# Patient Record
Sex: Female | Born: 1960 | Race: White | Hispanic: No | Marital: Married | Smoking: Never smoker
Health system: Southern US, Community
[De-identification: ages and names within clinical notes are randomized; demographics above are authoritative.]

## PROBLEM LIST (undated history)

## (undated) DIAGNOSIS — F32A Depression, unspecified: Secondary | ICD-10-CM

## (undated) DIAGNOSIS — G4733 Obstructive sleep apnea (adult) (pediatric): Secondary | ICD-10-CM

## (undated) DIAGNOSIS — F5104 Psychophysiologic insomnia: Secondary | ICD-10-CM

## (undated) DIAGNOSIS — E119 Type 2 diabetes mellitus without complications: Secondary | ICD-10-CM

## (undated) DIAGNOSIS — F329 Major depressive disorder, single episode, unspecified: Secondary | ICD-10-CM

## (undated) HISTORY — DX: Major depressive disorder, single episode, unspecified: F32.9

## (undated) HISTORY — DX: Obstructive sleep apnea (adult) (pediatric): G47.33

## (undated) HISTORY — PX: ABLATION: SHX5711

## (undated) HISTORY — PX: UTERINE FIBROID SURGERY: SHX826

## (undated) HISTORY — DX: Depression, unspecified: F32.A

## (undated) HISTORY — DX: Psychophysiologic insomnia: F51.04

## (undated) HISTORY — DX: Type 2 diabetes mellitus without complications: E11.9

---

## 1999-02-21 ENCOUNTER — Ambulatory Visit (HOSPITAL_COMMUNITY): Admission: RE | Admit: 1999-02-21 | Discharge: 1999-02-21 | Payer: Self-pay | Admitting: Obstetrics and Gynecology

## 1999-02-21 ENCOUNTER — Encounter: Payer: Self-pay | Admitting: Obstetrics and Gynecology

## 1999-03-17 ENCOUNTER — Inpatient Hospital Stay (HOSPITAL_COMMUNITY): Admission: RE | Admit: 1999-03-17 | Discharge: 1999-03-20 | Payer: Self-pay | Admitting: Obstetrics and Gynecology

## 1999-07-25 ENCOUNTER — Other Ambulatory Visit: Admission: RE | Admit: 1999-07-25 | Discharge: 1999-07-25 | Payer: Self-pay | Admitting: Obstetrics and Gynecology

## 2002-04-24 ENCOUNTER — Encounter: Payer: Self-pay | Admitting: Obstetrics and Gynecology

## 2002-04-24 ENCOUNTER — Encounter: Admission: RE | Admit: 2002-04-24 | Discharge: 2002-04-24 | Payer: Self-pay | Admitting: Obstetrics and Gynecology

## 2003-04-29 ENCOUNTER — Other Ambulatory Visit: Admission: RE | Admit: 2003-04-29 | Discharge: 2003-04-29 | Payer: Self-pay | Admitting: Obstetrics and Gynecology

## 2003-05-18 ENCOUNTER — Encounter: Admission: RE | Admit: 2003-05-18 | Discharge: 2003-05-18 | Payer: Self-pay | Admitting: Obstetrics and Gynecology

## 2003-05-18 ENCOUNTER — Encounter: Payer: Self-pay | Admitting: Obstetrics and Gynecology

## 2004-05-08 ENCOUNTER — Other Ambulatory Visit: Admission: RE | Admit: 2004-05-08 | Discharge: 2004-05-08 | Payer: Self-pay | Admitting: Obstetrics and Gynecology

## 2004-06-06 ENCOUNTER — Encounter: Admission: RE | Admit: 2004-06-06 | Discharge: 2004-06-06 | Payer: Self-pay | Admitting: Obstetrics and Gynecology

## 2006-09-26 ENCOUNTER — Other Ambulatory Visit: Admission: RE | Admit: 2006-09-26 | Discharge: 2006-09-26 | Payer: Self-pay | Admitting: Obstetrics and Gynecology

## 2006-09-26 ENCOUNTER — Ambulatory Visit (HOSPITAL_COMMUNITY): Admission: RE | Admit: 2006-09-26 | Discharge: 2006-09-26 | Payer: Self-pay | Admitting: Obstetrics and Gynecology

## 2007-09-29 ENCOUNTER — Other Ambulatory Visit: Admission: RE | Admit: 2007-09-29 | Discharge: 2007-09-29 | Payer: Self-pay | Admitting: Obstetrics and Gynecology

## 2007-09-29 ENCOUNTER — Ambulatory Visit (HOSPITAL_COMMUNITY): Admission: RE | Admit: 2007-09-29 | Discharge: 2007-09-29 | Payer: Self-pay | Admitting: Obstetrics and Gynecology

## 2008-11-21 ENCOUNTER — Emergency Department: Payer: Self-pay | Admitting: Emergency Medicine

## 2011-02-19 ENCOUNTER — Ambulatory Visit: Payer: Self-pay | Admitting: Obstetrics and Gynecology

## 2011-02-22 ENCOUNTER — Ambulatory Visit: Payer: Self-pay | Admitting: Obstetrics and Gynecology

## 2011-02-25 LAB — PATHOLOGY REPORT

## 2011-02-28 ENCOUNTER — Ambulatory Visit: Payer: Self-pay | Admitting: Unknown Physician Specialty

## 2011-10-30 HISTORY — PX: CHOLECYSTECTOMY, LAPAROSCOPIC: SHX56

## 2013-05-18 ENCOUNTER — Emergency Department: Payer: Self-pay | Admitting: Emergency Medicine

## 2013-05-18 LAB — URINALYSIS, COMPLETE
Leukocyte Esterase: NEGATIVE
Nitrite: NEGATIVE
Ph: 5 (ref 4.5–8.0)
RBC,UR: <1 /HPF (ref 0–5)
Specific Gravity: 1.014 (ref 1.003–1.030)
Squamous Epithelial: 1

## 2013-05-18 LAB — COMPREHENSIVE METABOLIC PANEL
Alkaline Phosphatase: 89 U/L (ref 50–136)
Anion Gap: 4 — ABNORMAL LOW (ref 7–16)
Bilirubin,Total: 0.6 mg/dL (ref 0.2–1.0)
Calcium, Total: 8.7 mg/dL (ref 8.5–10.1)
Chloride: 106 mmol/L (ref 98–107)
Co2: 26 mmol/L (ref 21–32)
EGFR (African American): >60
EGFR (Non-African Amer.): >60
Glucose: 93 mg/dL (ref 65–99)
Potassium: 4 mmol/L (ref 3.5–5.1)
SGPT (ALT): 44 U/L (ref 12–78)

## 2013-05-18 LAB — CBC
HCT: 40.4 % (ref 35.0–47.0)
HGB: 13.7 g/dL (ref 12.0–16.0)
MCH: 32.7 pg (ref 26.0–34.0)
MCHC: 34 g/dL (ref 32.0–36.0)
MCV: 96 fL (ref 80–100)
Platelet: 217 10*3/uL (ref 150–440)
RBC: 4.21 10*6/uL (ref 3.80–5.20)
RDW: 13.3 % (ref 11.5–14.5)
WBC: 11 10*3/uL (ref 3.6–11.0)

## 2013-05-18 LAB — LIPASE, BLOOD: Lipase: 106 U/L (ref 73–393)

## 2013-05-25 ENCOUNTER — Ambulatory Visit: Payer: Self-pay | Admitting: Unknown Physician Specialty

## 2014-10-29 HISTORY — PX: ENDOMETRIAL ABLATION: SHX621

## 2016-06-07 DIAGNOSIS — G4733 Obstructive sleep apnea (adult) (pediatric): Secondary | ICD-10-CM | POA: Insufficient documentation

## 2016-10-05 ENCOUNTER — Ambulatory Visit (INDEPENDENT_AMBULATORY_CARE_PROVIDER_SITE_OTHER): Payer: 59

## 2016-10-05 ENCOUNTER — Ambulatory Visit (INDEPENDENT_AMBULATORY_CARE_PROVIDER_SITE_OTHER): Payer: 59 | Admitting: Podiatry

## 2016-10-05 VITALS — BP 128/82 | HR 65

## 2016-10-05 DIAGNOSIS — M2010 Hallux valgus (acquired), unspecified foot: Secondary | ICD-10-CM | POA: Diagnosis not present

## 2016-10-05 NOTE — Progress Notes (Signed)
   Subjective:    Patient ID: Diana Zimmerman, female    DOB: [birth year], 55 y.o.   MRN: [id number]  HPI    Review of Systems  All other systems reviewed and are negative.      Objective:   Physical Exam        Assessment & Plan:

## 2016-10-05 NOTE — Patient Instructions (Addendum)
Bunionectomy A bunionectomy is a surgical procedure to remove a bunion. A bunion is a visible bump of bone on the inside of your foot where your big toe meets the rest of your foot. A bunion can develop when pressure turns this bone (first metatarsal) toward the other toes. Shoes that are too tight are the most common cause of bunions. Bunions can also be caused by diseases, such as arthritis and polio. You may need a bunionectomy if your bunion is very large and painful or it affects your ability to walk. Tell a health care provider about:  Any allergies you have.  All medicines you are taking, including vitamins, herbs, eye drops, creams, and over-the-counter medicines.  Any problems you or family members have had with anesthetic medicines.  Any blood disorders you have.  Any surgeries you have had.  Any medical conditions you have. What are the risks? Generally, this is a safe procedure. However, problems may occur, including:  Infection.  Pain.  Nerve damage.  Bleeding or blood clots.  Reactions to medicines.  Numbness, stiffness, or arthritis in your toe.  Foot problems that continue even after the procedure. What happens before the procedure?  Ask your health care provider about:  Changing or stopping your regular medicines. This is especially important if you are taking diabetes medicines or blood thinners.  Taking medicines such as aspirin and ibuprofen. These medicines can thin your blood. Do not take these medicines before your procedure if your health care provider instructs you not to.  Do not drink alcohol before the procedure as directed by your health care provider.  Do not use tobacco products, including cigarettes, chewing tobacco, or electronic cigarettes, before the procedure as directed by your health care provider. If you need help quitting, ask your health care provider.  Ask your health care provider what kind of medicine you will be given during  your procedure. A bunionectomy may be done using one of these:  A medicine that numbs the area (local anesthetic).  A medicine that makes you go to sleep (general anesthetic). If you will be given general anesthetic, do not eat or drink anything after midnight on the night before the procedure or as directed by your health care provider. What happens during the procedure?  An IV tube may be inserted into a vein.  You will be given local anesthetic or general anesthetic.  The surgeon will make a cut (incision) over the enlarged area at the first joint of the big toe. The surgeon will remove the bunion.  You may have more than one incision if any of the bones in your big toe need to be moved. A bone itself may need to be cut.  Sometimes the tissues around the big toe may also need to be cut then tightened or loosened to reposition the toe.  Screws or other hardware may be used to keep your foot in thecorrect position.  The incision will be closed with stitches (sutures) and covered with adhesive strips or another type of bandage (dressing). What happens after the procedure?  You may spend some time in a recovery area.  Your blood pressure, heart rate, breathing rate, and blood oxygen level will be monitored often until the medicines you were given have worn off. This information is not intended to replace advice given to you by your health care provider. Make sure you discuss any questions you have with your health care provider. Document Released: 09/28/2005 Document Revised: 03/22/2016 Document Reviewed: 06/02/2014   Elsevier Interactive Patient Education  2017 [employer]  Pre-Operative Instructions  Congratulations, you have decided to take an important step to improving your quality of life.  You can be assured that the doctors of [facility] will be with you every step of the way.  1. Plan to be at the surgery center/hospital at least 1 (one) hour prior to your scheduled  time unless otherwise directed by the surgical center/hospital staff.  You must have a responsible adult accompany you, remain during the surgery and drive you home.  Make sure you have directions to the surgical center/hospital and know how to get there on time. 2. For hospital based surgery you will need to obtain a history and physical form from your family physician within 1 month prior to the date of surgery- we will give you a form for you primary physician.  3. We make every effort to accommodate the date you request for surgery.  There are however, times where surgery dates or times have to be moved.  We will contact you as soon as possible if a change in schedule is required.   4. No Aspirin/Ibuprofen for one week before surgery.  If you are on aspirin, any non-steroidal anti-inflammatory medications (Mobic, Aleve, Ibuprofen) you should stop taking it 7 days prior to your surgery.  You make take Tylenol  For pain prior to surgery.  5. Medications- If you are taking daily heart and blood pressure medications, seizure, reflux, allergy, asthma, anxiety, pain or diabetes medications, make sure the surgery center/hospital is aware before the day of surgery so they may notify you which medications to take or avoid the day of surgery. 6. No food or drink after midnight the night before surgery unless directed otherwise by surgical center/hospital staff. 7. No alcoholic beverages 24 hours prior to surgery.  No smoking 24 hours prior to or 24 hours after surgery. 8. Wear loose pants or shorts- loose enough to fit over bandages, boots, and casts. 9. No slip on shoes, sneakers are best. 10. Bring your boot with you to the surgery center/hospital.  Also bring crutches or a walker if your physician has prescribed it for you.  If you do not have this equipment, it will be provided for you after surgery. 11. If you have not been contracted by the surgery center/hospital by the day before your surgery, call to  confirm the date and time of your surgery. 12. Leave-time from work may vary depending on the type of surgery you have.  Appropriate arrangements should be made prior to surgery with your employer. 13. Prescriptions will be provided immediately following surgery by your doctor.  Have these filled as soon as possible after surgery and take the medication as directed. 14. Remove nail polish on the operative foot. 15. Wash the night before surgery.  The night before surgery wash the foot and leg well with the antibacterial soap provided and water paying special attention to beneath the toenails and in between the toes.  Rinse thoroughly with water and dry well with a towel.  Perform this wash unless told not to do so by your physician.  Enclosed: 1 Ice pack (please put in freezer the night before surgery)   1 Hibiclens skin cleaner   Pre-op Instructions  If you have any questions regarding the instructions, do not hesitate to call our office.  [place]: [address] [place], [place] [address] [phone]  [place]: [address] [place], [place] [address] [phone]  [place]: [address]  [place], [place] [address] [phone]   Dr. Norman Regal DPM, Dr. Matthew Wagoner DPM, Dr. M. Todd Hyatt DPM, Dr. Titorya Stover DPM  

## 2016-10-05 NOTE — Progress Notes (Signed)
Subjective:     Patient ID: Diana Zimmerman, female   DOB: 1961-07-18, 55 y.o.   MRN: [id number]  HPI patient states I have this painful bunion on my left foot that's been present for several years. States that she has tried wider shoes she's tried soaks she's tried padding and oral anti-inflammatories without relief and it's becoming gradually more tender. The right one is also mildly symptomatic but nowhere to the degree of the left   Review of Systems  All other systems reviewed and are negative.      Objective:   Physical Exam  Constitutional: She is oriented to person, place, and time.  Cardiovascular: Intact distal pulses.   Musculoskeletal: Normal range of motion.  Neurological: She is oriented to person, place, and time.  Skin: Skin is warm.  Nursing note and vitals reviewed.  neurovascular status intact muscle strength adequate range of motion within normal limits. Patient's found have good digital perfusion is well oriented 3 and is noted to have hyperostosis medial aspect first metatarsal head left over right with redness and pain when palpated. 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 I discussed deformity and she states that she wants to get it corrected and needs to do it as soon as possible due to her work schedule. I did have a cancellation next Tuesday to be able to get her in and at this time I did allow her to read a consent form going over alternative treatments complications and the fact that recovery will be approximately 6 months. Patient wants surgery understanding recovery and risk and signs consent form and is also dispensed air fracture walker at  this time with all instructions on usage and is encouraged to call with any further questions prior to procedure  X-ray report indicates on the left that the intermetatarsal angle between the first and second metatarsal bones is approximately 15 with tibial sesamoidal position elevation. On the right foot it is mildly increased to 12

## 2016-10-09 ENCOUNTER — Encounter: Payer: Self-pay | Admitting: Podiatry

## 2016-10-09 ENCOUNTER — Telehealth: Payer: Self-pay | Admitting: *Deleted

## 2016-10-09 DIAGNOSIS — M2012 Hallux valgus (acquired), left foot: Secondary | ICD-10-CM | POA: Diagnosis not present

## 2016-10-09 NOTE — Telephone Encounter (Signed)
"  I'm scheduled to have surgery later on today.  I was given a scrub brush and was told to clean my foot with it.  I was reading the packet and it says to clean my foot with it.  Was I given the wrong thing?"  You are to use it on your foot.  "Okay, that's all I needed to know, that was an easy one for you."

## 2016-10-10 ENCOUNTER — Telehealth: Payer: Self-pay

## 2016-10-10 NOTE — Telephone Encounter (Signed)
LVM regarding post op status 

## 2016-10-11 NOTE — Progress Notes (Signed)
DOS 12.12.2017 [place] Bunionectomy (cutting and moving bone) with Pin Fixation Left

## 2016-10-15 ENCOUNTER — Ambulatory Visit (INDEPENDENT_AMBULATORY_CARE_PROVIDER_SITE_OTHER): Payer: Self-pay | Admitting: Podiatry

## 2016-10-15 ENCOUNTER — Ambulatory Visit (INDEPENDENT_AMBULATORY_CARE_PROVIDER_SITE_OTHER): Payer: 59

## 2016-10-15 ENCOUNTER — Encounter: Payer: Self-pay | Admitting: Podiatry

## 2016-10-15 VITALS — Temp 97.3°F

## 2016-10-15 DIAGNOSIS — M2010 Hallux valgus (acquired), unspecified foot: Secondary | ICD-10-CM | POA: Diagnosis not present

## 2016-10-15 DIAGNOSIS — Z9889 Other specified postprocedural states: Secondary | ICD-10-CM

## 2016-10-15 MED ORDER — OXYCODONE-ACETAMINOPHEN 10-325 MG PO TABS: 1.0000 | ORAL_TABLET | Freq: Three times a day (TID) | ORAL | 0 refills | Status: DC | PRN

## 2016-10-15 NOTE — Progress Notes (Signed)
Subjective :  Patient presents today status post [place] bunionectom left foot. Patient doing well. Minimal pain noted.   Objective :  Incision sites are well coapted with sutures intact. Minimal edema noted. Ecchymosis noted. No sign or evidence of infectious process.   Assessment :  S/p bunionectomy with metatarsal osteotomy left foot.   Plan of care :  Patient evaluated. Dressings changed. Return to clinic in 3 weeks.

## 2016-10-16 ENCOUNTER — Telehealth: Payer: Self-pay | Admitting: *Deleted

## 2016-10-16 NOTE — Telephone Encounter (Signed)
"  Calling regarding [employer].  Her surgery has been approved, case number is [id number].  Approval letter will be sent by mail."

## 2016-10-25 ENCOUNTER — Ambulatory Visit (INDEPENDENT_AMBULATORY_CARE_PROVIDER_SITE_OTHER): Payer: 59 | Admitting: Podiatry

## 2016-10-25 ENCOUNTER — Ambulatory Visit (INDEPENDENT_AMBULATORY_CARE_PROVIDER_SITE_OTHER): Payer: 59

## 2016-10-25 ENCOUNTER — Encounter: Payer: Self-pay | Admitting: Podiatry

## 2016-10-25 DIAGNOSIS — M2010 Hallux valgus (acquired), unspecified foot: Secondary | ICD-10-CM | POA: Diagnosis not present

## 2016-10-25 NOTE — Progress Notes (Signed)
Subjective:     Patient ID: Diana Zimmerman, female   DOB: 20-Jul-1961, 55 y.o.   MRN: [id number]  HPI patient states that she's doing well with just some swelling that she was concerned about that she wanted to get checked   Review of Systems     Objective:   Physical Exam Neurovascular status intact negative Homans sign was noted with well-healed surgical site right first metatarsal with wound edges well coapted and good range of motion    Assessment:     Mild edema in the dorsum of the joint surface    Plan:     Precautionary x-ray taken and discussed continued elevation compression and not walking on this part of her foot along with continued immobilization. Patient be checked back 4 weeks or earlier if needed  X-ray indicated osteotomy is healing well pins in place with no indications of movement

## 2016-11-22 ENCOUNTER — Encounter: Payer: Self-pay | Admitting: *Deleted

## 2016-11-22 ENCOUNTER — Emergency Department
Admission: EM | Admit: 2016-11-22 | Discharge: 2016-11-23 | Disposition: A | Discharge status: Home / Self Care | Payer: 59 | Attending: Emergency Medicine | Admitting: Emergency Medicine

## 2016-11-22 ENCOUNTER — Encounter: Payer: Self-pay | Admitting: Podiatry

## 2016-11-22 ENCOUNTER — Ambulatory Visit (INDEPENDENT_AMBULATORY_CARE_PROVIDER_SITE_OTHER): Payer: Self-pay | Admitting: Podiatry

## 2016-11-22 ENCOUNTER — Ambulatory Visit (INDEPENDENT_AMBULATORY_CARE_PROVIDER_SITE_OTHER): Payer: 59

## 2016-11-22 VITALS — BP 111/71 | HR 69 | Resp 16

## 2016-11-22 DIAGNOSIS — M21619 Bunion of unspecified foot: Secondary | ICD-10-CM | POA: Diagnosis not present

## 2016-11-22 DIAGNOSIS — K5732 Diverticulitis of large intestine without perforation or abscess without bleeding: Secondary | ICD-10-CM | POA: Diagnosis not present

## 2016-11-22 DIAGNOSIS — Z79899 Other long term (current) drug therapy: Secondary | ICD-10-CM | POA: Diagnosis not present

## 2016-11-22 DIAGNOSIS — R1032 Left lower quadrant pain: Secondary | ICD-10-CM | POA: Diagnosis present

## 2016-11-22 LAB — CBC
HCT: 44 % (ref 35.0–47.0)
HEMOGLOBIN: 14.9 g/dL (ref 12.0–16.0)
MCH: 32.1 pg (ref 26.0–34.0)
MCHC: 33.8 g/dL (ref 32.0–36.0)
MCV: 94.9 fL (ref 80.0–100.0)
PLATELETS: 210 10*3/uL (ref 150–440)
RBC: 4.63 MIL/uL (ref 3.80–5.20)
RDW: 12.9 % (ref 11.5–14.5)
WBC: 14.5 10*3/uL — ABNORMAL HIGH (ref 3.6–11.0)

## 2016-11-22 LAB — URINALYSIS, COMPLETE (UACMP) WITH MICROSCOPIC
Bacteria, UA: NONE SEEN
Bilirubin Urine: NEGATIVE
GLUCOSE, UA: NEGATIVE mg/dL
HGB URINE DIPSTICK: NEGATIVE
Ketones, ur: NEGATIVE mg/dL
NITRITE: NEGATIVE
PH: 6 (ref 5.0–8.0)
PROTEIN: NEGATIVE mg/dL
SPECIFIC GRAVITY, URINE: 1.016 (ref 1.005–1.030)

## 2016-11-22 LAB — COMPREHENSIVE METABOLIC PANEL
ALK PHOS: 81 U/L (ref 38–126)
ALT: 39 U/L (ref 14–54)
ANION GAP: 8 (ref 5–15)
AST: 27 U/L (ref 15–41)
Albumin: 4.1 g/dL (ref 3.5–5.0)
BILIRUBIN TOTAL: 1.1 mg/dL (ref 0.3–1.2)
BUN: 15 mg/dL (ref 6–20)
CALCIUM: 9.2 mg/dL (ref 8.9–10.3)
CO2: 28 mmol/L (ref 22–32)
CREATININE: 1.12 mg/dL — AB (ref 0.44–1.00)
Chloride: 100 mmol/L — ABNORMAL LOW (ref 101–111)
GFR calc non Af Amer: 53 mL/min — ABNORMAL LOW (ref >60)
GLUCOSE: 118 mg/dL — AB (ref 65–99)
Potassium: 4.1 mmol/L (ref 3.5–5.1)
Sodium: 136 mmol/L (ref 135–145)
TOTAL PROTEIN: 7.8 g/dL (ref 6.5–8.1)

## 2016-11-22 LAB — LIPASE, BLOOD: Lipase: 26 U/L (ref 11–51)

## 2016-11-22 MED ORDER — ONDANSETRON HCL 4 MG/2ML IJ SOLN
4.0000 mg | Freq: Once | INTRAMUSCULAR | Status: AC
  Administered 2016-11-22: 4 mg via INTRAVENOUS

## 2016-11-22 MED ORDER — MORPHINE SULFATE (PF) 2 MG/ML IV SOLN
2.0000 mg | Freq: Once | INTRAVENOUS | Status: AC
  Administered 2016-11-22: 2 mg via INTRAVENOUS

## 2016-11-22 MED ORDER — ONDANSETRON HCL 4 MG/2ML IJ SOLN
INTRAMUSCULAR | Status: AC
  Administered 2016-11-22: 4 mg via INTRAVENOUS
  Filled 2016-11-22: qty 2

## 2016-11-22 MED ORDER — MORPHINE SULFATE (PF) 2 MG/ML IV SOLN
INTRAVENOUS | Status: AC
  Administered 2016-11-22: 2 mg via INTRAVENOUS
  Filled 2016-11-22: qty 1

## 2016-11-22 MED ORDER — IOPAMIDOL (ISOVUE-300) INJECTION 61%
30.0000 mL | Freq: Once | INTRAVENOUS | Status: AC | PRN
  Administered 2016-11-22: 30 mL via ORAL

## 2016-11-22 NOTE — Progress Notes (Signed)
Subjective:     Patient ID: Diana Zimmerman, female   DOB: [birth year], 56 y.o.   MRN: [id number]  HPI patient points to left foot stating that she's doing very well and that she's very happy with result   Review of Systems     Objective:   Physical Exam Neurovascular status intact negative Homan sign was noted with patient's left foot doing well with wound edges well coapted good alignment and good range of motion    Assessment:     Doing well post osteotomy first metatarsal left    Plan:     Reviewed x-rays and advised on continued range of motion exercises and supportive shoe gear usage  X-rays indicate that there is good healing of the osteotomy pins in place with no indications of movement

## 2016-11-22 NOTE — ED Triage Notes (Signed)
Pt ambulatory to triage.  Pt has left lower abd pain.  Pt also has lower back pain.  Pt has nausea.   No vag discharge or bleeding.  Pt alert.

## 2016-11-22 NOTE — ED Provider Notes (Signed)
[facility] Emergency Department Provider Note    First MD Initiated Contact with Patient 11/22/16 2325     (approximate)  I have reviewed the triage vital signs and the nursing notes.   HISTORY  Chief Complaint Abdominal Pain    HPI Diana Zimmerman is a 56 y.o. female presents with acute onset of 5 out of 10 left lower quadrant pain nausea for 1 day. Patient denies any vomiting constipation or diarrhea. Patient temp to 100 on arrival to the urgency department. Patient denies hematuria no dysuria or frequency or urgency.   Past medical History None There are no active problems to display for this patient.   Past surgical history None  Prior to Admission medications   Medication Sig Start Date End Date Taking? Authorizing Provider  ciprofloxacin (CIPRO) 500 MG tablet Take 1 tablet (500 mg total) by mouth 2 (two) times daily. 11/23/16 12/03/16  Darci Current, MD  citalopram (CELEXA) 40 MG tablet Take by mouth. 09/13/16   Historical Provider, MD  metroNIDAZOLE (FLAGYL) 500 MG tablet Take 1 tablet (500 mg total) by mouth 2 (two) times daily. 11/23/16 12/03/16  Darci Current, MD  ondansetron (ZOFRAN ODT) 4 MG disintegrating tablet Take 1 tablet (4 mg total) by mouth every 8 (eight) hours as needed for nausea or vomiting. 11/23/16   Darci Current, MD  oxyCODONE-acetaminophen (ROXICET) 5-325 MG tablet Take 1 tablet by mouth every 6 (six) hours as needed. 11/23/16 11/23/17  Darci Current, MD  traZODone (DESYREL) 50 MG tablet Take by mouth. 08/22/16 08/22/17  Historical Provider, MD    Allergies Patient has no known allergies.  No family history on file.  Social History Social History  Substance Use Topics  . Smoking status: Never Smoker  . Smokeless tobacco: Never Used  . Alcohol use No    Review of Systems Constitutional: No fever/chills Eyes: No visual changes. ENT: No sore throat. Cardiovascular: Denies chest pain. Respiratory: Denies  shortness of breath. Gastrointestinal:Positive for left lower quadrant abdominal pain and nausea Genitourinary: Negative for dysuria. Musculoskeletal: Negative for back pain. Skin: Negative for rash. Neurological: Negative for headaches, focal weakness or numbness.  10-point ROS otherwise negative.  ____________________________________________   PHYSICAL EXAM:  VITAL SIGNS: ED Triage Vitals  Enc Vitals Group     BP 11/22/16 1917 (!) 146/80     Pulse Rate 11/22/16 1917 86     Resp 11/22/16 1917 (!) 22     Temp 11/22/16 1917 100 F (37.8 C)     Temp Source 11/22/16 1917 Oral     SpO2 11/22/16 1917 99 %     Weight 11/22/16 1918 190 lb (86.2 kg)     Height 11/22/16 1918 5\' 4"  (1.626 m)     Head Circumference --      Peak Flow --      Pain Score 11/22/16 1919 5     Pain Loc --      Pain Edu? --      Excl. in GC? --     Constitutional: Alert and oriented. Apparent discomfort Eyes: Conjunctivae are normal. PERRL. EOMI. Head: Atraumatic. Mouth/Throat: Mucous membranes are moist.  Oropharynx non-erythematous. Neck: No stridor.   Cardiovascular: Normal rate, regular rhythm. Good peripheral circulation. Grossly normal heart sounds. Respiratory: Normal respiratory effort.  No retractions. Lungs CTAB. Gastrointestinal: LLQ pain with palpattion No distention.  Musculoskeletal: No lower extremity tenderness nor edema. No gross deformities of extremities. Neurologic:  Normal speech and language. No  gross focal neurologic deficits are appreciated.  Skin:  Skin is warm, dry and intact. No rash noted.   ____________________________________________   LABS (all labs ordered are listed, but only abnormal results are displayed)  Labs Reviewed  COMPREHENSIVE METABOLIC PANEL - Abnormal; Notable for the following:       Result Value   Chloride 100 (*)    Glucose, Bld 118 (*)    Creatinine, Ser 1.12 (*)    GFR calc non Af Amer 53 (*)    All other components within normal limits  CBC -  Abnormal; Notable for the following:    WBC 14.5 (*)    All other components within normal limits  URINALYSIS, COMPLETE (UACMP) WITH MICROSCOPIC - Abnormal; Notable for the following:    Color, Urine YELLOW (*)    APPearance CLEAR (*)    Leukocytes, UA LARGE (*)    Squamous Epithelial / LPF 0-5 (*)    All other components within normal limits  LIPASE, BLOOD    RADIOLOGY I, Troy N BROWN, personally viewed and evaluated these images (plain radiographs) as part of my medical decision making, as well as reviewing the written report by the radiologist.  Ct Abdomen Pelvis W Contrast  Result Date: 11/23/2016 CLINICAL DATA:  56 year old female with left lower quadrant abdominal pain and nausea. EXAM: CT ABDOMEN AND PELVIS WITH CONTRAST TECHNIQUE: Multidetector CT imaging of the abdomen and pelvis was performed using the standard protocol following bolus administration of intravenous contrast. CONTRAST:  ISOVUE-300 IOPAMIDOL (ISOVUE-300) INJECTION 61% COMPARISON:  Abdominal CT dated 11/21/2008 FINDINGS: Lower chest: The visualized lung bases are clear. No intra-abdominal free air or free fluid. Hepatobiliary: Diffuse fatty infiltration of the liver. No intrahepatic biliary ductal dilatation. Cholecystectomy. Mild dilatation of the CBD with normal tapering at the head of the pancreas, likely post cholecystectomy. No retained CBD stone. Pancreas: Unremarkable. No pancreatic ductal dilatation or surrounding inflammatory changes. Spleen: Normal in size without focal abnormality. Adrenals/Urinary Tract: The adrenal glands appear unremarkable. There is an 11 mm left renal upper pole cyst. Subcentimeter hypodense lesion in the upper pole of the left kidney is too small to characterize but most likely represents a cyst. The kidneys otherwise appear unremarkable. There is symmetric uptake and excretion of contrast by the kidneys. The visualized ureters and urinary bladder appear unremarkable. Stomach/Bowel:  There is sigmoid diverticulosis. There is focal area of thickening and inflammatory changes of the distal descending/ sigmoid junction compatible with acute diverticulitis. No evidence of diverticular perforation or abscess. There are scattered colonic diverticula. There is no evidence of bowel obstruction. Normal appendix. Vascular/Lymphatic: No significant vascular findings are present. No enlarged abdominal or pelvic lymph nodes. Reproductive: The uterus is anteverted and grossly unremarkable. The ovaries appear unremarkable as well. No pelvic mass. Other: Small fat containing umbilical hernia. Musculoskeletal: Mild degenerative changes of the spine. No acute fracture. IMPRESSION: Sigmoid diverticulitis.  No diverticular abscess or perforation. Fatty liver. Electronically Signed   By: Elgie Collard M.D.   On: 11/23/2016 01:04      Procedures    INITIAL IMPRESSION / ASSESSMENT AND PLAN / ED COURSE  Pertinent labs & imaging results that were available during my care of the patient were reviewed by me and considered in my medical decision making (see chart for details).  56 year old female presents in the emergency department with left lower quadrant abdominal pain consistent with possible diverticulitis which was confirmed on CT scan. Patient given Cipro and Flagyl in the emergency department as well  as IV morphine for pain with improvement of pain.      ____________________________________________  FINAL CLINICAL IMPRESSION(S) / ED DIAGNOSES  Final diagnoses:  Diverticulitis of large intestine without perforation or abscess without bleeding     MEDICATIONS GIVEN DURING THIS VISIT:  Medications  ciprofloxacin (CIPRO) tablet 500 mg (not administered)  metroNIDAZOLE (FLAGYL) tablet 500 mg (not administered)  morphine 2 MG/ML injection 2 mg (2 mg Intravenous Given 11/22/16 2340)  ondansetron (ZOFRAN) injection 4 mg (4 mg Intravenous Given 11/22/16 2345)  iopamidol (ISOVUE-300) 61 %  injection 30 mL (30 mLs Oral Contrast Given 11/22/16 2359)  iopamidol (ISOVUE-300) 61 % injection 100 mL (100 mLs Intravenous Contrast Given 11/23/16 0033)  oxyCODONE-acetaminophen (PERCOCET/ROXICET) 5-325 MG per tablet 1 tablet (1 tablet Oral Given 11/23/16 0157)     NEW OUTPATIENT MEDICATIONS STARTED DURING THIS VISIT:  New Prescriptions   CIPROFLOXACIN (CIPRO) 500 MG TABLET    Take 1 tablet (500 mg total) by mouth 2 (two) times daily.   METRONIDAZOLE (FLAGYL) 500 MG TABLET    Take 1 tablet (500 mg total) by mouth 2 (two) times daily.   ONDANSETRON (ZOFRAN ODT) 4 MG DISINTEGRATING TABLET    Take 1 tablet (4 mg total) by mouth every 8 (eight) hours as needed for nausea or vomiting.   OXYCODONE-ACETAMINOPHEN (ROXICET) 5-325 MG TABLET    Take 1 tablet by mouth every 6 (six) hours as needed.    Modified Medications   No medications on file    Discontinued Medications   No medications on file     Note:  This document was prepared using Dragon voice recognition software and may include unintentional dictation errors.    Darci Currentandolph N Brown, MD 11/23/16 Emeline [id number]

## 2016-11-22 NOTE — ED Notes (Signed)
Dr. Brown at bedside for pt evaluation  

## 2016-11-23 ENCOUNTER — Encounter: Payer: Self-pay | Admitting: Radiology

## 2016-11-23 ENCOUNTER — Emergency Department: Payer: 59

## 2016-11-23 MED ORDER — CIPROFLOXACIN HCL 500 MG PO TABS: 500.0000 mg | ORAL_TABLET | Freq: Two times a day (BID) | ORAL | 0 refills | Status: AC

## 2016-11-23 MED ORDER — AMOXICILLIN-POT CLAVULANATE 875-125 MG PO TABS: 1.0000 | ORAL_TABLET | Freq: Once | ORAL | Status: DC

## 2016-11-23 MED ORDER — OXYCODONE-ACETAMINOPHEN 5-325 MG PO TABS
1.0000 | ORAL_TABLET | Freq: Once | ORAL | Status: AC
Start: 2016-11-23 — End: 2016-11-23
  Administered 2016-11-23: 1 via ORAL

## 2016-11-23 MED ORDER — CIPROFLOXACIN HCL 500 MG PO TABS
500.0000 mg | ORAL_TABLET | Freq: Once | ORAL | Status: AC
  Administered 2016-11-23: 500 mg via ORAL
  Filled 2016-11-23: qty 1

## 2016-11-23 MED ORDER — ONDANSETRON 4 MG PO TBDP: 4.0000 mg | ORAL_TABLET | Freq: Three times a day (TID) | ORAL | 0 refills | Status: DC | PRN

## 2016-11-23 MED ORDER — METRONIDAZOLE 500 MG PO TABS: 500.0000 mg | ORAL_TABLET | Freq: Two times a day (BID) | ORAL | 0 refills | Status: AC

## 2016-11-23 MED ORDER — METRONIDAZOLE 500 MG PO TABS
500.0000 mg | ORAL_TABLET | Freq: Once | ORAL | Status: AC
  Administered 2016-11-23: 500 mg via ORAL
  Filled 2016-11-23: qty 1

## 2016-11-23 MED ORDER — IOPAMIDOL (ISOVUE-300) INJECTION 61%
100.0000 mL | Freq: Once | INTRAVENOUS | Status: AC | PRN
  Administered 2016-11-23: 100 mL via INTRAVENOUS

## 2016-11-23 MED ORDER — OXYCODONE-ACETAMINOPHEN 5-325 MG PO TABS
ORAL_TABLET | ORAL | Status: AC
  Administered 2016-11-23: 1 via ORAL
  Filled 2016-11-23: qty 1

## 2016-11-23 MED ORDER — OXYCODONE-ACETAMINOPHEN 5-325 MG PO TABS: 1.0000 | ORAL_TABLET | Freq: Four times a day (QID) | ORAL | 0 refills | Status: AC | PRN

## 2016-11-23 NOTE — ED Notes (Signed)
Dr. Brown at the bedside to discuss plan of care 

## 2016-12-27 ENCOUNTER — Ambulatory Visit (INDEPENDENT_AMBULATORY_CARE_PROVIDER_SITE_OTHER): Payer: 59 | Admitting: Podiatry

## 2016-12-27 ENCOUNTER — Ambulatory Visit (INDEPENDENT_AMBULATORY_CARE_PROVIDER_SITE_OTHER): Payer: 59

## 2016-12-27 DIAGNOSIS — Z9889 Other specified postprocedural states: Secondary | ICD-10-CM

## 2016-12-27 DIAGNOSIS — M2010 Hallux valgus (acquired), unspecified foot: Secondary | ICD-10-CM | POA: Diagnosis not present

## 2016-12-27 DIAGNOSIS — M21619 Bunion of unspecified foot: Secondary | ICD-10-CM | POA: Diagnosis not present

## 2016-12-27 NOTE — Progress Notes (Signed)
Subjective:     Patient ID: Diana Zimmerman, female   DOB: 1961-08-15, 56 y.o.   MRN: [id number]  HPI patient states she's doing real well with minimal discomfort and able to wear shoe gear   Review of Systems     Objective:   Physical Exam Neurovascular status intact negative Homan sign was noted with well-healed surgical site left    Assessment:     Doing well post osteotomy left    Plan:     Advised on anti-inflammatories and gradual return to normal shoe gear and reviewed x-rays with reduced immobilization  X-ray report indicated osteotomy is healing well pins in place with no signs of movement

## 2017-02-28 ENCOUNTER — Ambulatory Visit (INDEPENDENT_AMBULATORY_CARE_PROVIDER_SITE_OTHER): Payer: Self-pay | Admitting: Podiatry

## 2017-02-28 ENCOUNTER — Other Ambulatory Visit: Payer: 59

## 2017-02-28 ENCOUNTER — Ambulatory Visit (INDEPENDENT_AMBULATORY_CARE_PROVIDER_SITE_OTHER): Payer: 59

## 2017-02-28 DIAGNOSIS — M21619 Bunion of unspecified foot: Secondary | ICD-10-CM | POA: Diagnosis not present

## 2017-02-28 DIAGNOSIS — M2010 Hallux valgus (acquired), unspecified foot: Secondary | ICD-10-CM

## 2017-02-28 DIAGNOSIS — Z9889 Other specified postprocedural states: Secondary | ICD-10-CM | POA: Diagnosis not present

## 2017-03-02 NOTE — Progress Notes (Signed)
Subjective:    Patient ID: Diana Zimmerman, female   DOB: 56 y.o.   MRN: [id number]   HPI patient states I'm doing fine with minimal discomfort    ROS      Objective:  Physical Exam Neurovascular status intact with patient found to have inflammation pain around the first MPJ left with nice improvement that occurred over the last 8 weeks with minimal swelling still noted    Assessment:    Doing well post osteotomy first metatarsal left with wound edges well coapted hallux in rectus position and good range of motion     Plan:    Doing well with explanation that she will continue to utilize elevation compression as needed and will gradually increase her shoe gear type   X-rays indicate yes M is healing well with pins in place joint congruence and no indication to pathology

## 2017-05-08 DIAGNOSIS — R200 Anesthesia of skin: Secondary | ICD-10-CM | POA: Insufficient documentation

## 2017-05-08 DIAGNOSIS — R202 Paresthesia of skin: Secondary | ICD-10-CM | POA: Insufficient documentation

## 2017-05-15 DIAGNOSIS — G5602 Carpal tunnel syndrome, left upper limb: Secondary | ICD-10-CM | POA: Insufficient documentation

## 2017-06-13 DIAGNOSIS — R2681 Unsteadiness on feet: Secondary | ICD-10-CM | POA: Insufficient documentation

## 2017-06-13 DIAGNOSIS — R278 Other lack of coordination: Secondary | ICD-10-CM | POA: Insufficient documentation

## 2017-07-10 DIAGNOSIS — H5711 Ocular pain, right eye: Secondary | ICD-10-CM | POA: Insufficient documentation

## 2017-07-10 DIAGNOSIS — R519 Headache, unspecified: Secondary | ICD-10-CM | POA: Insufficient documentation

## 2017-08-08 DIAGNOSIS — H02834 Dermatochalasis of left upper eyelid: Secondary | ICD-10-CM | POA: Insufficient documentation

## 2018-09-23 ENCOUNTER — Other Ambulatory Visit (HOSPITAL_COMMUNITY)
Admission: RE | Admit: 2018-09-23 | Discharge: 2018-09-23 | Disposition: A | Discharge status: Home / Self Care | Payer: 59 | Source: Ambulatory Visit | Attending: Obstetrics and Gynecology | Admitting: Obstetrics and Gynecology

## 2018-09-23 ENCOUNTER — Ambulatory Visit (INDEPENDENT_AMBULATORY_CARE_PROVIDER_SITE_OTHER): Payer: 59 | Admitting: Obstetrics and Gynecology

## 2018-09-23 ENCOUNTER — Encounter: Payer: Self-pay | Admitting: Obstetrics and Gynecology

## 2018-09-23 VITALS — BP 120/88 | Ht 64.0 in | Wt 205.0 lb

## 2018-09-23 DIAGNOSIS — Z0000 Encounter for general adult medical examination without abnormal findings: Secondary | ICD-10-CM | POA: Insufficient documentation

## 2018-09-23 DIAGNOSIS — Z124 Encounter for screening for malignant neoplasm of cervix: Secondary | ICD-10-CM

## 2018-09-23 DIAGNOSIS — Z1239 Encounter for other screening for malignant neoplasm of breast: Secondary | ICD-10-CM

## 2018-09-23 DIAGNOSIS — Z01419 Encounter for gynecological examination (general) (routine) without abnormal findings: Secondary | ICD-10-CM

## 2018-09-23 NOTE — Progress Notes (Signed)
Gynecology Annual Exam  PCP: Rayetta HumphreyGeorge, Sionne A, MD  Chief Complaint:  Chief Complaint  Patient presents with  . Gynecologic Exam    History of Present Illness:Patient is a 57 y.o. G1P0101 presents for annual exam. The patient has no complaints today.   LMP: No LMP recorded. Patient has had an ablation. Menarche:not applicable  Vaginal Bleeding: no Postcoital Bleeding: no Dysmenorrhea: not applicable  The patient is sexually active. She admits to dyspareunia.  The patient does perform self breast exams.  There is no notable family history of breast or ovarian cancer in her family.  The patient wears seatbelts: yes.   The patient has regular exercise: not asked.    The patient denies current symptoms of depression.     Review of Systems: Review of Systems  Constitutional: Negative for chills, fever, malaise/fatigue and weight loss.  HENT: Negative for congestion, hearing loss and sinus pain.   Eyes: Negative for blurred vision and double vision.  Respiratory: Negative for cough, sputum production, shortness of breath and wheezing.   Cardiovascular: Negative for chest pain, palpitations, orthopnea and leg swelling.  Gastrointestinal: Positive for diarrhea. Negative for abdominal pain, constipation, nausea and vomiting.       Chronic diarrhea, work up negative  Genitourinary: Negative for dysuria, flank pain, frequency, hematuria and urgency.       + leakage of urine  Musculoskeletal: Negative for back pain, falls and joint pain.  Skin: Negative for itching and rash.  Neurological: Negative for dizziness and headaches.  Psychiatric/Behavioral: Negative for depression, substance abuse and suicidal ideas. The patient is not nervous/anxious.     Past Medical History:  Past Medical History:  Diagnosis Date  . Depression   . Diabetes mellitus without complication [facility](HCC)     Past Surgical History:  Past Surgical History:  Procedure Laterality Date  . ABLATION    .  CESAREAN SECTION    . UTERINE FIBROID SURGERY      Gynecologic History:  No LMP recorded. Patient has had an ablation. Last Pap: Results were:  3 years ago, normal per patient Mammogram results: 3 years ago normal per patient  Obstetric History: G1P0101  Family History:  Family History  Problem Relation Age of Onset  . Colon cancer Mother   . Heart disease Father     Social History:  Social History   Socioeconomic History  . Marital status: Married    Spouse name: Not on file  . Number of children: Not on file  . Years of education: Not on file  . Highest education level: Not on file  Occupational History  . Not on file  Social Needs  . Financial resource strain: Not on file  . Food insecurity:    Worry: Not on file    Inability: Not on file  . Transportation needs:    Medical: Not on file    Non-medical: Not on file  Tobacco Use  . Smoking status: Never Smoker  . Smokeless tobacco: Never Used  Substance and Sexual Activity  . Alcohol use: Yes    Comment: occasionally   . Drug use: Never  . Sexual activity: Yes    Birth control/protection: Surgical  Lifestyle  . Physical activity:    Days per week: Not on file    Minutes per session: Not on file  . Stress: Not on file  Relationships  . Social connections:    Talks on phone: Not on file    Gets together: Not on  file    Attends religious service: Not on file    Active member of club or organization: Not on file    Attends meetings of clubs or organizations: Not on file    Relationship status: Not on file  . Intimate partner violence:    Fear of current or ex partner: Not on file    Emotionally abused: Not on file    Physically abused: Not on file    Forced sexual activity: Not on file  Other Topics Concern  . Not on file  Social History Narrative  . Not on file    Allergies:  No Known Allergies  Medications: Prior to Admission medications   Medication Sig Start Date End Date Taking? Authorizing  Provider  buPROPion (WELLBUTRIN XL) 150 MG 24 hr tablet Take by mouth. 09/02/18 09/02/19 Yes [provider]  citalopram (CELEXA) 40 MG tablet Take by mouth. 09/13/16  Yes [provider]  Multiple Vitamin (MULTI-VITAMINS) TABS Take by mouth.   Yes [provider]  polyethylene glycol (GAVILYTE-G) 236 g solution as directed 06/06/18  Yes [provider]  gabapentin (NEURONTIN) 300 MG capsule TAKE 3 CAPSULES (900 MG TOTAL) BY MOUTH NIGHTLY. 03/15/17   [provider]  traZODone (DESYREL) 50 MG tablet Take by mouth. 08/22/16 08/22/17  [provider]    Physical Exam Vitals: Blood pressure 120/88, height 5\' 4"  (1.626 m), weight 205 lb (93 kg).  General: NAD HEENT: normocephalic, anicteric Thyroid: no enlargement, no palpable nodules Pulmonary: No increased work of breathing, CTAB Cardiovascular: RRR, distal pulses 2+ Breast: Breast symmetrical, no tenderness, no palpable nodules or masses, no skin or nipple retraction present, no nipple discharge.  No axillary or supraclavicular lymphadenopathy. Abdomen: NABS, soft, non-tender, non-distended.  Umbilicus without lesions.  No hepatomegaly, splenomegaly or masses palpable. No evidence of hernia  Genitourinary:  External: Normal external female genitalia.  Normal urethral meatus, normal Bartholin's and Skene's glands.    Vagina: Normal vaginal mucosa, no evidence of prolapse.    Cervix: Grossly normal in appearance, no bleeding  Uterus: Non-enlarged, mobile, normal contour.  No CMT  Adnexa: ovaries non-enlarged, no adnexal masses  Rectal: deferred  Lymphatic: no evidence of inguinal lymphadenopathy Extremities: no edema, erythema, or tenderness Neurologic: Grossly intact Psychiatric: mood appropriate, affect full  Female chaperone present for pelvic and breast  portions of the physical exam     Assessment: 57 y.o. G1P0101 routine annual exam  Plan: Problem List Items Addressed This  Visit    None    Visit Diagnoses    Healthcare maintenance    -  Primary   Relevant Orders   Cytology - PAP   MM DIGITAL SCREENING BILATERAL   Screening for cervical cancer       Relevant Orders   Cytology - PAP   Screening breast examination       Relevant Orders   MM DIGITAL SCREENING BILATERAL      1) Mammogram - recommend yearly screening mammogram.  Mammogram Was ordered today  2) STI screening  was offered and declined  3) ASCCP guidelines and rational discussed.  Patient opts for every 3 years screening interval  4) Osteoporosis  - per USPTF routine screening DEXA at age 4 - FRAX 10 year major fracture risk 74,  10 year hip fracture risk 1  Consider FDA-approved medical therapies in postmenopausal women and men aged 61 years and older, based on the following: a) A hip or vertebral (clinical or morphometric) fracture b) T-score ? -  2.5 at the femoral neck or spine after appropriate evaluation to exclude secondary causes C) Low bone mass (T-score between -1.0 and -2.5 at the femoral neck or spine) and a 10-year probability of a hip fracture ? 3% or a 10-year probability of a major osteoporosis-related fracture ? 20% based on the [place]-adapted WHO algorithm   5) Routine healthcare maintenance including cholesterol, diabetes screening discussed managed by PCP  6) Colonoscopy is up to date. She reports that she had multiple colon polyps on her last one and has to have another colonoscopy in a few months.   7) Return in about 1 year (around 09/24/2019) for annual.  8) Issues with urinary incontinence- reviewed options- no treatment at this time Vaginal narrowing- pain with intercourse, reviewed options- no treatment at this time, given sample of premarin cream.  Adelene Idler MD [facility], [facility] 09/23/18 5:30 PM

## 2018-09-26 LAB — CYTOLOGY - PAP
Diagnosis: NEGATIVE
HPV: NOT DETECTED

## 2018-09-29 NOTE — Progress Notes (Signed)
Please call patient with normal pap smear result

## 2018-09-30 NOTE — Progress Notes (Signed)
Pt is aware.  

## 2019-01-09 ENCOUNTER — Encounter: Payer: Self-pay | Admitting: Gynecology

## 2019-01-09 ENCOUNTER — Other Ambulatory Visit: Payer: Self-pay

## 2019-01-09 ENCOUNTER — Ambulatory Visit
Admission: EM | Admit: 2019-01-09 | Discharge: 2019-01-09 | Disposition: A | Discharge status: Home / Self Care | Payer: 59 | Attending: Family Medicine | Admitting: Family Medicine

## 2019-01-09 DIAGNOSIS — R053 Chronic cough: Secondary | ICD-10-CM

## 2019-01-09 DIAGNOSIS — R05 Cough: Secondary | ICD-10-CM | POA: Diagnosis not present

## 2019-01-09 MED ORDER — BECLOMETHASONE DIPROP HFA 80 MCG/ACT IN AERB: 1.0000 | INHALATION_SPRAY | Freq: Two times a day (BID) | RESPIRATORY_TRACT | 0 refills | Status: DC

## 2019-01-09 MED ORDER — PREDNISONE 50 MG PO TABS: ORAL_TABLET | ORAL | 0 refills | Status: DC

## 2019-01-09 MED ORDER — ALBUTEROL SULFATE HFA 108 (90 BASE) MCG/ACT IN AERS: 1.0000 | INHALATION_SPRAY | Freq: Four times a day (QID) | RESPIRATORY_TRACT | 0 refills | Status: DC | PRN

## 2019-01-09 NOTE — ED Provider Notes (Signed)
[facility]  CSN: [id number] Arrival date & time: 01/09/19  1317  History   Chief Complaint Chief Complaint  Patient presents with  . Cough   HPI  58 year old female presents with cough.  Patient reports that she has been coughing for the past month.  Worse over this past week.  Nonproductive.  She reports chest congestion.  No documented fever.  No other respiratory symptoms.  No known exacerbating or relieving factors.  No other associated symptoms.  No other complaints.  PMH, Surgical Hx, Family Hx, Social History reviewed and updated as below.  Past Medical History:  Diagnosis Date  . Depression   . Diabetes mellitus without complication [facility])    Past Surgical History:  Procedure Laterality Date  . ABLATION    . CESAREAN SECTION    . UTERINE FIBROID SURGERY     OB History    Gravida  1   Para  1   Term      Preterm  1   AB      Living  1     SAB      TAB      Ectopic      Multiple      Live Births  1          Home Medications    Prior to Admission medications   Medication Sig Start Date End Date Taking? Authorizing Provider  buPROPion (WELLBUTRIN XL) 150 MG 24 hr tablet Take by mouth. 09/02/18 09/02/19 Yes [provider]  citalopram (CELEXA) 40 MG tablet Take by mouth. 09/13/16  Yes [provider]  metFORMIN (GLUCOPHAGE-XR) 750 MG 24 hr tablet  11/24/18  Yes [provider]  Multiple Vitamin (MULTI-VITAMINS) TABS Take by mouth.   Yes [provider]  polyethylene glycol (GAVILYTE-G) 236 g solution as directed 06/06/18  Yes [provider]  traZODone (DESYREL) 50 MG tablet Take by mouth. 08/22/16 01/09/19 Yes [provider]  albuterol (PROVENTIL HFA;VENTOLIN HFA) 108 (90 Base) MCG/ACT inhaler Inhale 1-2 puffs into the lungs every 6 (six) hours as needed for wheezing or shortness of breath. 01/09/19   Tommie Sams, DO  beclomethasone (QVAR) 80 MCG/ACT inhaler Inhale 1 puff into the  lungs 2 (two) times daily. 01/09/19   Tommie Sams, DO  predniSONE (DELTASONE) 50 MG tablet 1 tablet daily x 5 days 01/09/19   Tommie Sams, DO    Family History Family History  Problem Relation Age of Onset  . Colon cancer Mother   . Heart disease Father     Social History Social History   Tobacco Use  . Smoking status: Never Smoker  . Smokeless tobacco: Never Used  Substance Use Topics  . Alcohol use: Yes    Comment: occasionally   . Drug use: Never     Allergies   Patient has no known allergies.   Review of Systems Review of Systems  Constitutional: Negative for fever.  Respiratory: Positive for cough.    Physical Exam Triage Vital Signs ED Triage Vitals  Enc Vitals Group     BP 01/09/19 1404 (!) 135/92     Pulse Rate 01/09/19 1404 66     Resp 01/09/19 1404 16     Temp 01/09/19 1404 98.3 F (36.8 C)     Temp Source 01/09/19 1404 Oral     SpO2 01/09/19 1404 99 %     Weight 01/09/19 1400 201 lb (91.2 kg)     Height  01/09/19 1400 5\' 5"  (1.651 m)     Head Circumference --      Peak Flow --      Pain Score 01/09/19 1400 4     Pain Loc --      Pain Edu? --    Updated Vital Signs BP (!) 135/92 (BP Location: Left Arm)   Pulse 66   Temp 98.3 F (36.8 C) (Oral)   Resp 16   Ht 5\' 5"  (1.651 m)   Wt 91.2 kg   SpO2 99%   BMI 33.45 kg/m   Visual Acuity Right Eye Distance:   Left Eye Distance:   Bilateral Distance:    Right Eye Near:   Left Eye Near:    Bilateral Near:     Physical Exam Vitals signs and nursing note reviewed.  Constitutional:      General: She is not in acute distress.    Appearance: Normal appearance.  HENT:     Head: Normocephalic and atraumatic.     Mouth/Throat:     Pharynx: Oropharynx is clear. No oropharyngeal exudate.  Eyes:     General:        Right eye: No discharge.        Left eye: No discharge.     Conjunctiva/sclera: Conjunctivae normal.  Cardiovascular:     Rate and Rhythm: Normal rate and regular rhythm.   Pulmonary:     Effort: Pulmonary effort is normal.     Breath sounds: Normal breath sounds.  Neurological:     Mental Status: She is alert.  Psychiatric:        Mood and Affect: Mood normal.        Behavior: Behavior normal.    UC Treatments / Results  Labs (all labs ordered are listed, but only abnormal results are displayed) Labs Reviewed - No data to display  EKG None  Radiology No results found.  Procedures Procedures (including critical care time)  Medications Ordered in UC Medications - No data to display  Initial Impression / Assessment and Plan / UC Course  I have reviewed the triage vital signs and the nursing notes.  Pertinent labs & imaging results that were available during my care of the patient were reviewed by me and considered in my medical decision making (see chart for details).    58 year old female presents with chronic cough.  Recommended follow-up with her primary care physician.  May need pulmonary referral.  Treating with albuterol and Qvar.  Prednisone burst.  Final Clinical Impressions(s) / UC Diagnoses   Final diagnoses:  Chronic cough   Discharge Instructions   None    ED Prescriptions    Medication Sig Dispense Auth. Provider   albuterol (PROVENTIL HFA;VENTOLIN HFA) 108 (90 Base) MCG/ACT inhaler Inhale 1-2 puffs into the lungs every 6 (six) hours as needed for wheezing or shortness of breath. 1 Inhaler Trayquan Kolakowski G, DO   beclomethasone (QVAR) 80 MCG/ACT inhaler Inhale 1 puff into the lungs 2 (two) times daily. 1 Inhaler Anjelo Pullman G, DO   predniSONE (DELTASONE) 50 MG tablet 1 tablet daily x 5 days 5 tablet Tommie Sams, DO     Controlled Substance Prescriptions [place] Controlled Substance Registry consulted? Not Applicable   Tommie Sams, DO 01/09/19 1739

## 2019-01-09 NOTE — ED Triage Notes (Signed)
Patient c/o x 1 month. Per patient coughing worse this week. Per pt. Chest congestion

## 2019-02-14 ENCOUNTER — Other Ambulatory Visit: Payer: Self-pay | Admitting: Family Medicine

## 2019-02-23 ENCOUNTER — Other Ambulatory Visit: Payer: Self-pay | Admitting: Family Medicine

## 2019-09-11 ENCOUNTER — Ambulatory Visit (INDEPENDENT_AMBULATORY_CARE_PROVIDER_SITE_OTHER): Payer: 59

## 2019-09-11 ENCOUNTER — Encounter: Payer: Self-pay | Admitting: Podiatry

## 2019-09-11 ENCOUNTER — Ambulatory Visit: Payer: 59 | Admitting: Podiatry

## 2019-09-11 ENCOUNTER — Other Ambulatory Visit: Payer: Self-pay

## 2019-09-11 ENCOUNTER — Other Ambulatory Visit: Payer: Self-pay | Admitting: Podiatry

## 2019-09-11 DIAGNOSIS — F329 Major depressive disorder, single episode, unspecified: Secondary | ICD-10-CM | POA: Insufficient documentation

## 2019-09-11 DIAGNOSIS — E538 Deficiency of other specified B group vitamins: Secondary | ICD-10-CM | POA: Insufficient documentation

## 2019-09-11 DIAGNOSIS — M67472 Ganglion, left ankle and foot: Secondary | ICD-10-CM

## 2019-09-11 DIAGNOSIS — R739 Hyperglycemia, unspecified: Secondary | ICD-10-CM | POA: Insufficient documentation

## 2019-09-11 DIAGNOSIS — M779 Enthesopathy, unspecified: Secondary | ICD-10-CM

## 2019-09-11 DIAGNOSIS — G4700 Insomnia, unspecified: Secondary | ICD-10-CM | POA: Insufficient documentation

## 2019-09-11 DIAGNOSIS — F32A Depression, unspecified: Secondary | ICD-10-CM | POA: Insufficient documentation

## 2019-09-14 NOTE — Progress Notes (Signed)
   HPI: 58 y.o. female presenting today for evaluation of a knot on the top of the patient's left foot that is been going on for approximately 6-7 months now.  She does get sharp shooting pins-and-needles and shooting pains that extend down to her toes.  Aggravated by shoes.  Anything that touches the area is very painful.  She has not done anything for treatment.  Past Medical History:  Diagnosis Date  . Depression   . Diabetes mellitus without complication [facility])      Physical Exam: General: The patient is alert and oriented x3 in no acute distress.  Dermatology: Skin is warm, dry and supple bilateral lower extremities. Negative for open lesions or macerations.  Vascular: Palpable pedal pulses bilaterally. No edema or erythema noted. Capillary refill within normal limits.  Neurological: Epicritic and protective threshold grossly intact bilaterally.  Positive Tinel's sign with percussion of the dorsal medial cutaneous nerve  Musculoskeletal Exam: Range of motion within normal limits to all pedal and ankle joints bilateral. Muscle strength 5/5 in all groups bilateral.  Palpable fluctuant mass noted to the dorsal aspect of the left foot consistent with a ganglion cyst  Radiographic Exam:  Normal osseous mineralization. Joint spaces preserved. No fracture/dislocation/boney destruction.    Assessment: 1.  Ganglion cyst left foot 2.  Neuritis left foot   Plan of Care:  1. Patient evaluated. X-Rays reviewed.  2.  Treatment options were discussed.  Today we opted to attempt to drain the ganglion cyst.  Prior to aspiration and drainage of the cyst 3 mL of lidocaine with epinephrine was utilized over the area of the localized fashion.  The foot was prepped in aseptic manner and an 18-gauge needle was inserted into the ganglion cyst and aspirated.  There was a minimal amount of hyaluronic fluid expressed. 3.  After aspiration, injection of 0.5 cc Celestone Soluspan injected into the area.   Compression dressing applied. 4.  Postoperative shoe dispensed.  Weightbearing as tolerated. 5.  Return to clinic in 1 week.  If there is not improvement we will discuss going to the surgery center to have it surgically removed and also performed neurolysis of the dorsal medial cutaneous nerve around the area.      Edrick Kins, DPM [facility]  Dr. Edrick Kins, DPM    [address], [place] [address]                Office [phone]  Fax [phone]

## 2019-09-18 ENCOUNTER — Encounter: Payer: Self-pay | Admitting: Podiatry

## 2019-09-18 ENCOUNTER — Other Ambulatory Visit: Payer: Self-pay

## 2019-09-18 ENCOUNTER — Ambulatory Visit (INDEPENDENT_AMBULATORY_CARE_PROVIDER_SITE_OTHER): Payer: 59 | Admitting: Podiatry

## 2019-09-18 DIAGNOSIS — M898X7 Other specified disorders of bone, ankle and foot: Secondary | ICD-10-CM

## 2019-09-18 DIAGNOSIS — M67472 Ganglion, left ankle and foot: Secondary | ICD-10-CM | POA: Diagnosis not present

## 2019-09-18 NOTE — Patient Instructions (Signed)
Pre-Operative Instructions  Congratulations, you have decided to take an important step towards improving your quality of life.  You can be assured that the doctors and staff at [facility] will be with you every step of the way.  Here are some important things you should know:  1. Plan to be at the surgery center/hospital at least 1 (one) hour prior to your scheduled time, unless otherwise directed by the surgical center/hospital staff.  You must have a responsible adult accompany you, remain during the surgery and drive you home.  Make sure you have directions to the surgical center/hospital to ensure you arrive on time. 2. If you are having surgery at [facility] or [facility] hospitals, you will need a copy of your medical history and physical form from your family physician within one month prior to the date of surgery. We will give you a form for your primary physician to complete.  3. We make every effort to accommodate the date you request for surgery.  However, there are times where surgery dates or times have to be moved.  We will contact you as soon as possible if a change in schedule is required.   4. No aspirin/ibuprofen for one week before surgery.  If you are on aspirin, any non-steroidal anti-inflammatory medications (Mobic, Aleve, Ibuprofen) should not be taken seven (7) days prior to your surgery.  You make take Tylenol for pain prior to surgery.  5. Medications - If you are taking daily heart and blood pressure medications, seizure, reflux, allergy, asthma, anxiety, pain or diabetes medications, make sure you notify the surgery center/hospital before the day of surgery so they can tell you which medications you should take or avoid the day of surgery. 6. No food or drink after midnight the night before surgery unless directed otherwise by surgical center/hospital staff. 7. No alcoholic beverages 24-hours prior to surgery.  No smoking 24-hours prior or 24-hours after  surgery. 8. Wear loose pants or shorts. They should be loose enough to fit over bandages, boots, and casts. 9. Don't wear slip-on shoes. Sneakers are preferred. 10. Bring your boot with you to the surgery center/hospital.  Also bring crutches or a walker if your physician has prescribed it for you.  If you do not have this equipment, it will be provided for you after surgery. 11. If you have not been contacted by the surgery center/hospital by the day before your surgery, call to confirm the date and time of your surgery. 12. Leave-time from work may vary depending on the type of surgery you have.  Appropriate arrangements should be made prior to surgery with your employer. 13. Prescriptions will be provided immediately following surgery by your doctor.  Fill these as soon as possible after surgery and take the medication as directed. Pain medications will not be refilled on weekends and must be approved by the doctor. 14. Remove nail polish on the operative foot and avoid getting pedicures prior to surgery. 15. Wash the night before surgery.  The night before surgery wash the foot and leg well with water and the antibacterial soap provided. Be sure to pay special attention to beneath the toenails and in between the toes.  Wash for at least three (3) minutes. Rinse thoroughly with water and dry well with a towel.  Perform this wash unless told not to do so by your physician.  Enclosed: 1 Ice pack (please put in freezer the night before surgery)   1 Hibiclens skin cleaner     Pre-op instructions  If you have any questions regarding the instructions, please do not hesitate to call our office.  [place]: [address], [place], [place] [address] -- [phone]  [place]: [address] , [place] [address] -- [phone]  [place]: [address]  [place], [place] [address] -- [phone]   Website: [email or web address] 

## 2019-09-22 ENCOUNTER — Telehealth: Payer: Self-pay | Admitting: *Deleted

## 2019-09-22 NOTE — Telephone Encounter (Signed)
"  I need to see about scheduling a surgical procedure.  If you could, give me a call back."  "I'm calling to schedule my surgery."  I was going to call you back, I just got your messages.  "I'm sorry, I was just getting ready to step out."  Do you have a date that you like?  "I'd like to do it before the end of the year.  I can do it anytime in December."  Dr. Amalia Hailey can do it on October 29, 2019.  "I'll take it."

## 2019-09-27 NOTE — Progress Notes (Signed)
   HPI: 58 y.o. female presenting today for follow up evaluation of left foot pain. She states the symptoms are the same and have not improved at all since her last visit. She states the knot on the foot is getting harder with associated swelling. She has been using the post op shoe as directed. Walking and being on the foot increases the pain. Patient is here for further evaluation and treatment.   Past Medical History:  Diagnosis Date  . Depression   . Diabetes mellitus without complication [facility])      Physical Exam: General: The patient is alert and oriented x3 in no acute distress.  Dermatology: Skin is warm, dry and supple bilateral lower extremities. Negative for open lesions or macerations.  Vascular: Palpable pedal pulses bilaterally. No edema or erythema noted. Capillary refill within normal limits.  Neurological: Epicritic and protective threshold grossly intact bilaterally. Positive Tinel's sign with percussion of the dorsal medial cutaneous nerve  Musculoskeletal Exam: Range of motion within normal limits to all pedal and ankle joints bilateral. Muscle strength 5/5 in all groups bilateral.  Palpable fluctuant mass noted to the dorsal aspect of the left foot consistent with a ganglion cyst.  Tarsal exostosis noted to the left foot.   Assessment: 1. Ganglion cyst left foot 2. Tarsal exostosis left  3. Neuritis left foot    Plan of Care:  1. Patient evaluated. 2. Today we discussed the conservative versus surgical management of the presenting pathology. The patient opts for surgical management. All possible complications and details of the procedure were explained. All patient questions were answered. No guarantees were expressed or implied. 3. Authorization for surgery was initiated today. Surgery will consist of excision ganglion cyst left; tarsal exostectomy left; neurolysis of nerve left.  4. Return to clinic one week post op.       Edrick Kins, DPM [facility]  Dr. Edrick Kins, DPM    [address], [place] [address]                Office [phone]  Fax [phone]

## 2019-10-08 ENCOUNTER — Telehealth: Payer: Self-pay | Admitting: *Deleted

## 2019-10-08 NOTE — Telephone Encounter (Signed)
DOS 10/29/2019 EXC. GANGLION / TUMOR - 79480 AND TARSAL EXOSTECTOMY - 28104 OF THE LEFT FOOT  [employer]: Eligibility Date - 10/29/2018- 10/29/2019  Member's plan does not have an In-Network individual plan deductible  Out-of-Pocket Maximum Per Calendar Year $1,328.13 of $6,550.00 Met Remaining: $5,221.87  Co-Insurance 20%   This Passenger transport manager plan does not currently require a prior authorization for these services. If you have general questions about the prior authorization requirements, please call us at [phone] or visit [email or web address] > Clinician Resources > Advance and Admission Notification Requirements. The number above acknowledges your notification. Please write this number down for future reference. Notification is not a guarantee of coverage or payment.  Decision ID #:[id number]

## 2019-10-29 ENCOUNTER — Encounter: Payer: Self-pay | Admitting: Podiatry

## 2019-10-29 ENCOUNTER — Other Ambulatory Visit: Payer: Self-pay | Admitting: Podiatry

## 2019-10-29 DIAGNOSIS — G5762 Lesion of plantar nerve, left lower limb: Secondary | ICD-10-CM

## 2019-10-29 DIAGNOSIS — M25775 Osteophyte, left foot: Secondary | ICD-10-CM

## 2019-10-29 DIAGNOSIS — M67472 Ganglion, left ankle and foot: Secondary | ICD-10-CM

## 2019-10-29 MED ORDER — OXYCODONE-ACETAMINOPHEN 5-325 MG PO TABS: 1.0000 | ORAL_TABLET | Freq: Four times a day (QID) | ORAL | 0 refills | Status: DC | PRN

## 2019-10-29 NOTE — Progress Notes (Signed)
PRN postop 

## 2019-11-02 ENCOUNTER — Telehealth: Payer: Self-pay

## 2019-11-02 NOTE — Telephone Encounter (Signed)
Patient called requested a doctors note that states no driving until after her post op appt on Friday 11/06/2019.  Please let her know when ready.  Thanks!

## 2019-11-03 ENCOUNTER — Encounter: Payer: Self-pay | Admitting: Podiatry

## 2019-11-06 ENCOUNTER — Ambulatory Visit (INDEPENDENT_AMBULATORY_CARE_PROVIDER_SITE_OTHER): Payer: Self-pay | Admitting: Podiatry

## 2019-11-06 ENCOUNTER — Ambulatory Visit (INDEPENDENT_AMBULATORY_CARE_PROVIDER_SITE_OTHER): Payer: 59

## 2019-11-06 ENCOUNTER — Encounter: Payer: Self-pay | Admitting: Podiatry

## 2019-11-06 ENCOUNTER — Other Ambulatory Visit: Payer: Self-pay

## 2019-11-06 VITALS — BP 129/80 | HR 69 | Temp 98.3°F

## 2019-11-06 DIAGNOSIS — M898X7 Other specified disorders of bone, ankle and foot: Secondary | ICD-10-CM | POA: Diagnosis not present

## 2019-11-06 DIAGNOSIS — Z9889 Other specified postprocedural states: Secondary | ICD-10-CM

## 2019-11-10 NOTE — Progress Notes (Signed)
   Subjective:  Patient presents today status post excision of ganglion cyst left. DOS: 10/29/2019. She states she was tripped up by her dog yesterday causing her to come down of the foot and hand. She reports moderate pain yesterday which has gotten much better since. She states she has not had to take any pain medications and her pain is tolerable. She has been using the post op shoe as directed. Patient is here for further evaluation and treatment.   Past Medical History:  Diagnosis Date  . Depression   . Diabetes mellitus without complication (HCC)       Objective/Physical Exam Neurovascular status intact.  Skin incisions appear to be well coapted with sutures and staples intact. No sign of infectious process noted. No dehiscence. No active bleeding noted. Moderate edema noted to the surgical extremity.  Radiographic Exam:  Normal osseous mineralization. Joint spaces preserved. No fracture/dislocation/boney destruction.    Assessment: 1. s/p excision of ganglion cyst left. DOS: 10/29/2019   Plan of Care:  1. Patient was evaluated. X-rays reviewed 2. Dressing changed.  3. Continue using post op shoe.  4. Return to clinic in one week.   Works at nursing homes.    Felecia Shelling, DPM [facility]  Dr. Felecia Shelling, DPM    [address]                                        [place], [place] [address]                Office [phone]  Fax [phone]

## 2019-11-17 ENCOUNTER — Other Ambulatory Visit: Payer: Self-pay

## 2019-11-17 ENCOUNTER — Ambulatory Visit (INDEPENDENT_AMBULATORY_CARE_PROVIDER_SITE_OTHER): Payer: 59 | Admitting: Podiatry

## 2019-11-17 ENCOUNTER — Encounter: Payer: Self-pay | Admitting: Podiatry

## 2019-11-17 DIAGNOSIS — Z9889 Other specified postprocedural states: Secondary | ICD-10-CM

## 2019-11-17 DIAGNOSIS — M898X7 Other specified disorders of bone, ankle and foot: Secondary | ICD-10-CM

## 2019-11-19 NOTE — Progress Notes (Signed)
   Subjective:  Patient presents today status post excision of ganglion cyst left. DOS: 10/29/2019. She states she is doing well and improving. She reports some intermittent shooting pain. She denies modifying factors. She has been using the post op shoe as directed. Patient is here for further evaluation and treatment.   Past Medical History:  Diagnosis Date  . Depression   . Diabetes mellitus without complication (HCC)       Objective/Physical Exam Neurovascular status intact.  Skin incisions appear to be well coapted with sutures and staples intact. No sign of infectious process noted. No dehiscence. No active bleeding noted. Moderate edema noted to the surgical extremity.  Assessment: 1. s/p excision of ganglion cyst left. DOS: 10/29/2019   Plan of Care:  1. Patient was evaluated.  2. Sutures removed.  3. Compression anklet dispensed.  4. Discontinue using post op shoe.  5. Return to clinic in 4 weeks.   Works at nursing homes.    Felecia Shelling, DPM [facility]  Dr. Felecia Shelling, DPM    [address]                                        [place], [place] [address]                Office [phone]  Fax [phone]

## 2019-12-01 ENCOUNTER — Encounter: Payer: 59 | Admitting: Podiatry

## 2019-12-15 ENCOUNTER — Ambulatory Visit (INDEPENDENT_AMBULATORY_CARE_PROVIDER_SITE_OTHER): Payer: 59 | Admitting: Podiatry

## 2019-12-15 ENCOUNTER — Other Ambulatory Visit: Payer: Self-pay

## 2019-12-15 DIAGNOSIS — Z9889 Other specified postprocedural states: Secondary | ICD-10-CM

## 2019-12-15 DIAGNOSIS — M898X7 Other specified disorders of bone, ankle and foot: Secondary | ICD-10-CM

## 2019-12-15 DIAGNOSIS — M67472 Ganglion, left ankle and foot: Secondary | ICD-10-CM

## 2019-12-17 NOTE — Progress Notes (Signed)
   Subjective:  Patient presents today status post excision of ganglion cyst left. DOS: 10/29/2019. She states she is doing well overall. She reports some intermittent minor "nerve" pain. She denies modifying factors. She has been using the compression anklet as directed. Patient is here for further evaluation and treatment.   Past Medical History:  Diagnosis Date  . Depression   . Diabetes mellitus without complication (HCC)       Objective/Physical Exam Neurovascular status intact.  Skin incisions appear to be well coapted. No sign of infectious process noted. No dehiscence. No active bleeding noted. Moderate edema noted to the surgical extremity.  Assessment: 1. s/p excision of ganglion cyst left. DOS: 10/29/2019   Plan of Care:  1. Patient was evaluated.  2. May resume full activity with no restrictions.  3. Recommended good shoe gear.  4. Return to clinic as needed.    Works at nursing homes.    Felecia Shelling, DPM [facility]  Dr. Felecia Shelling, DPM    [address]                                        [place], [place] [address]                Office [phone]  Fax [phone]

## 2020-01-13 ENCOUNTER — Ambulatory Visit: Payer: 59 | Admitting: Obstetrics and Gynecology

## 2020-01-27 ENCOUNTER — Ambulatory Visit: Payer: 59 | Admitting: Obstetrics and Gynecology

## 2020-02-11 ENCOUNTER — Ambulatory Visit: Payer: 59 | Admitting: Obstetrics and Gynecology

## 2020-02-25 ENCOUNTER — Other Ambulatory Visit: Payer: Self-pay

## 2020-02-25 ENCOUNTER — Ambulatory Visit (INDEPENDENT_AMBULATORY_CARE_PROVIDER_SITE_OTHER): Payer: 59 | Admitting: Obstetrics and Gynecology

## 2020-02-25 ENCOUNTER — Encounter: Payer: Self-pay | Admitting: Obstetrics and Gynecology

## 2020-02-25 VITALS — BP 131/86 | Wt 203.0 lb

## 2020-02-25 DIAGNOSIS — Z01419 Encounter for gynecological examination (general) (routine) without abnormal findings: Secondary | ICD-10-CM | POA: Diagnosis not present

## 2020-02-25 DIAGNOSIS — Z1339 Encounter for screening examination for other mental health and behavioral disorders: Secondary | ICD-10-CM

## 2020-02-25 DIAGNOSIS — Z1331 Encounter for screening for depression: Secondary | ICD-10-CM

## 2020-02-25 DIAGNOSIS — Z1231 Encounter for screening mammogram for malignant neoplasm of breast: Secondary | ICD-10-CM | POA: Diagnosis not present

## 2020-02-25 NOTE — Patient Instructions (Addendum)
[facility] at [facility]  [place] Located in: [facility] Address: First Floor - [facility], [address] [place], [place], [place] [address] Hours:  Open Closes 5PM Phone: [phone]

## 2020-02-25 NOTE — Progress Notes (Signed)
Routine Annual Gynecology Examination   PCP: Sharyne Peach, MD  Chief Complaint  Patient presents with  . Gynecologic Exam   History of Present Illness: Patient is a 59 y.o. G1P0101 presents for annual exam. The patient has no complaints today.   Menopausal bleeding: denies. Last issue was 3 years ago.  Menopausal symptoms: hot flashes  Breast symptoms: denies  Last pap smear: 1.5 years ago.  Result Normal  Last mammogram: a while.  Result Normal   Last Colonoscopy: 2020.  Normal. Follow up 5 years. (performed by [facility])  Not sexually active due to vaginal dryness and difficulty with her husband physically.   Past Medical History:  Diagnosis Date  . Chronic insomnia   . Depression   . Diabetes mellitus without complication ([place])   . OSA (obstructive sleep apnea)    CPAP at home    Past Surgical History:  Procedure Laterality Date  . CESAREAN SECTION    . CHOLECYSTECTOMY, LAPAROSCOPIC  2013  . ENDOMETRIAL ABLATION  2016  . UTERINE FIBROID SURGERY      Prior to Admission medications   Medication Sig Start Date End Date Taking? Authorizing Provider  buPROPion (WELLBUTRIN XL) 150 MG 24 hr tablet Take 150 mg by mouth at bedtime. 02/17/20  Yes [provider]  citalopram (CELEXA) 40 MG tablet Take by mouth. 09/13/16  Yes [provider]  metFORMIN (GLUCOPHAGE-XR) 750 MG 24 hr tablet  11/24/18  Yes [provider]  Multiple Vitamin (MULTI-VITAMINS) TABS Take by mouth.   Yes [provider]  traZODone (DESYREL) 50 MG tablet Take by mouth. 08/22/16 01/09/19  [provider]   Allergies: No Known Allergies  Obstetric History: [id number], s/p C-section, 19 years ago  Social History   Socioeconomic History  . Marital status: Married    Spouse name: Not on file  . Number of children: Not on file  . Years of education: Not on file  . Highest education level: Not on file  Occupational History  . Not on file    Tobacco Use  . Smoking status: Never Smoker  . Smokeless tobacco: Never Used  Substance and Sexual Activity  . Alcohol use: Yes    Comment: occasionally   . Drug use: Never  . Sexual activity: Not Currently    Birth control/protection: Surgical  Other Topics Concern  . Not on file  Social History Narrative  . Not on file   Social Determinants of Health   Financial Resource Strain:   . Difficulty of Paying Living Expenses:   Food Insecurity:   . Worried About Charity fundraiser in the Last Year:   . Arboriculturist in the Last Year:   Transportation Needs:   . Film/video editor (Medical):   Marland Kitchen Lack of Transportation (Non-Medical):   Physical Activity:   . Days of Exercise per Week:   . Minutes of Exercise per Session:   Stress:   . Feeling of Stress :   Social Connections:   . Frequency of Communication with Friends and Family:   . Frequency of Social Gatherings with Friends and Family:   . Attends Religious Services:   . Active Member of Clubs or Organizations:   . Attends Archivist Meetings:   Marland Kitchen Marital Status:   Intimate Partner Violence:   . Fear of Current or Ex-Partner:   . Emotionally Abused:   Marland Kitchen Physically Abused:   . Sexually Abused:     Family History  Problem Relation Age of Onset  . Colon cancer Mother   . Heart disease Father   . Diabetes Father   . Hypertension Father   . Diverticulitis Father   . Pancreatic cancer Paternal Uncle   . Throat cancer Paternal Uncle   . Breast cancer Neg Hx     Review of Systems  Constitutional: Negative.   HENT: Negative.   Eyes: Negative.   Respiratory: Negative.   Cardiovascular: Negative.   Gastrointestinal: Negative.   Genitourinary: Negative.   Musculoskeletal: Negative.   Skin: Negative.   Neurological: Negative.   Psychiatric/Behavioral: Negative.      Physical Exam Vitals: BP 131/86   Wt 203 lb (92.1 kg)   BMI 33.78 kg/m   Physical Exam Constitutional:      General: She is  not in acute distress.    Appearance: Normal appearance. She is well-developed.  Genitourinary:     Pelvic exam was performed with patient in the lithotomy position.     Vulva, urethra, bladder and uterus normal.     No inguinal adenopathy present in the right or left side.    No signs of injury in the vagina.     No vaginal discharge, erythema, tenderness or bleeding.     No cervical motion tenderness, discharge, lesion or polyp.     Uterus is mobile.     Uterus is not enlarged or tender.     No uterine mass detected.    Uterus is anteverted.     No right or left adnexal mass present.     Right adnexa not tender or full.     Left adnexa not tender or full.  HENT:     Head: Normocephalic and atraumatic.  Eyes:     General: No scleral icterus.    Conjunctiva/sclera: Conjunctivae normal.  Neck:     Thyroid: No thyromegaly.  Cardiovascular:     Rate and Rhythm: Normal rate and regular rhythm.     Heart sounds: No murmur. No friction rub. No gallop.   Pulmonary:     Effort: Pulmonary effort is normal. No respiratory distress.     Breath sounds: Normal breath sounds. No wheezing or rales.  Chest:     Breasts:        Right: No inverted nipple, mass, nipple discharge, skin change or tenderness.        Left: No inverted nipple, mass, nipple discharge, skin change or tenderness.  Abdominal:     General: Bowel sounds are normal. There is no distension.     Palpations: Abdomen is soft. There is no mass.     Tenderness: There is no abdominal tenderness. There is no guarding or rebound.  Musculoskeletal:        General: No swelling or tenderness. Normal range of motion.     Cervical back: Normal range of motion and neck supple.  Lymphadenopathy:     Cervical: No cervical adenopathy.     Lower Body: No right inguinal adenopathy. No left inguinal adenopathy.  Neurological:     General: No focal deficit present.     Mental Status: She is alert and oriented to person, place, and time.      Cranial Nerves: No cranial nerve deficit.  Skin:    General: Skin is warm and dry.     Findings: No erythema or rash.  Psychiatric:        Mood and Affect: Mood normal.        Behavior: Behavior normal.  Judgment: Judgment normal.      Female chaperone present for pelvic and breast  portions of the physical exam  Results: AUDIT Questionnaire (screen for alcoholism): 3 PHQ-9: 8   Assessment and Plan:  59 y.o. G49P0101 female here for routine annual gynecologic examination  Plan: Problem List Items Addressed This Visit    None    Visit Diagnoses    Women's annual routine gynecological examination    -  Primary   Relevant Orders   MM DIGITAL SCREENING BILATERAL   Screening for depression       Screening for alcoholism       Encounter for screening mammogram for malignant neoplasm of breast       Relevant Orders   MM DIGITAL SCREENING BILATERAL      Screening: -- Blood pressure screen normal -- Colonoscopy - not due -- Mammogram - due. Patient to call [facility] to arrange. She understands that it is her responsibility to arrange this. -- Weight screening: obese: discussed management options, including lifestyle, dietary, and exercise. -- Depression screening negative (PHQ-9) -- Nutrition: normal -- cholesterol screening: per PCP -- osteoporosis screening: not due -- tobacco screening: not using -- alcohol screening: AUDIT questionnaire indicates low-risk usage. -- family history of breast cancer screening: done. not at high risk. -- no evidence of domestic violence or intimate partner violence. -- STD screening: gonorrhea/chlamydia NAAT not collected per patient request. -- pap smear not collected per ASCCP guidelines -- flu vaccine received flu vaccine this season -- HPV vaccination series: not eligilbe  -- status post COVID19 vaccination x 2 doses (4 weeks ago)   Thomasene Mohair, MD 02/25/2020 1:09 PM

## 2020-05-31 ENCOUNTER — Ambulatory Visit (INDEPENDENT_AMBULATORY_CARE_PROVIDER_SITE_OTHER): Payer: 59 | Admitting: Obstetrics and Gynecology

## 2020-05-31 ENCOUNTER — Other Ambulatory Visit: Payer: Self-pay

## 2020-05-31 ENCOUNTER — Encounter: Payer: Self-pay | Admitting: Obstetrics and Gynecology

## 2020-05-31 VITALS — BP 132/74 | Ht 64.0 in | Wt 207.2 lb

## 2020-05-31 DIAGNOSIS — N898 Other specified noninflammatory disorders of vagina: Secondary | ICD-10-CM

## 2020-05-31 MED ORDER — VALACYCLOVIR HCL 1 G PO TABS: 1000.0000 mg | ORAL_TABLET | Freq: Two times a day (BID) | ORAL | 0 refills | Status: AC

## 2020-05-31 NOTE — Progress Notes (Signed)
Diana Humphrey, MD   Chief Complaint  Patient presents with  . Gynecologic Exam    she thinks she has a yest infection x 2 months.     HPI:      [name] is a 59 y.o. G1P0101 whose LMP was No LMP recorded. Patient has had an ablation., presents today for ext vaginal itching/irritation with d/c, no fishy odor for the past 2 months. Sx have persisted the whole 2 months, not intermittent. No meds to treat, no prior abx use. Trying not to scratch but very itchy. No change in soaps/detergents. Hx of DM. No urin sx, no pelvic pain. She is not sex active. No hx of HSV. Husband with hx of cold sores.    Past Medical History:  Diagnosis Date  . Chronic insomnia   . Depression   . Diabetes mellitus without complication (HCC)   . OSA (obstructive sleep apnea)    CPAP at home    Past Surgical History:  Procedure Laterality Date  . CESAREAN SECTION    . CHOLECYSTECTOMY, LAPAROSCOPIC  2013  . ENDOMETRIAL ABLATION  2016  . UTERINE FIBROID SURGERY      Family History  Problem Relation Age of Onset  . Colon cancer Mother   . Heart disease Father   . Diabetes Father   . Hypertension Father   . Diverticulitis Father   . Pancreatic cancer Paternal Uncle   . Throat cancer Paternal Uncle   . Breast cancer Neg Hx     Social History   Socioeconomic History  . Marital status: Married    Spouse name: Not on file  . Number of children: Not on file  . Years of education: Not on file  . Highest education level: Not on file  Occupational History  . Not on file  Tobacco Use  . Smoking status: Never Smoker  . Smokeless tobacco: Never Used  Vaping Use  . Vaping Use: Never used  Substance and Sexual Activity  . Alcohol use: Yes    Comment: occasionally   . Drug use: Never  . Sexual activity: Not Currently    Birth control/protection: Surgical  Other Topics Concern  . Not on file  Social History Narrative  . Not on file   Social Determinants of Health   Financial  Resource Strain:   . Difficulty of Paying Living Expenses:   Food Insecurity:   . Worried About Programme researcher, broadcasting/film/video in the Last Year:   . Barista in the Last Year:   Transportation Needs:   . Freight forwarder (Medical):   Marland Kitchen Lack of Transportation (Non-Medical):   Physical Activity:   . Days of Exercise per Week:   . Minutes of Exercise per Session:   Stress:   . Feeling of Stress :   Social Connections:   . Frequency of Communication with Friends and Family:   . Frequency of Social Gatherings with Friends and Family:   . Attends Religious Services:   . Active Member of Clubs or Organizations:   . Attends Banker Meetings:   Marland Kitchen Marital Status:   Intimate Partner Violence:   . Fear of Current or Ex-Partner:   . Emotionally Abused:   Marland Kitchen Physically Abused:   . Sexually Abused:     Outpatient Medications Prior to Visit  Medication Sig Dispense Refill  . buPROPion (WELLBUTRIN XL) 150 MG 24 hr tablet Take 150 mg by mouth at bedtime.    Marland Kitchen  citalopram (CELEXA) 40 MG tablet Take by mouth.    . metFORMIN (GLUCOPHAGE-XR) 750 MG 24 hr tablet     . Multiple Vitamin (MULTI-VITAMINS) TABS Take by mouth.    . traZODone (DESYREL) 50 MG tablet Take by mouth.     No facility-administered medications prior to visit.      ROS:  Review of Systems  Constitutional: Negative for fever.  Gastrointestinal: Negative for blood in stool, constipation, diarrhea, nausea and vomiting.  Genitourinary: Positive for vaginal discharge. Negative for dyspareunia, dysuria, flank pain, frequency, hematuria, urgency, vaginal bleeding and vaginal pain.  Musculoskeletal: Negative for back pain.  Skin: Negative for rash.    OBJECTIVE:   Vitals:  BP 132/74   Ht 5\' 4"  (1.626 m)   Wt 207 lb 3.2 oz (94 kg)   BMI 35.57 kg/m   Physical Exam Vitals reviewed.  Constitutional:      Appearance: She is well-developed.  Pulmonary:     Effort: Pulmonary effort is normal.   Genitourinary:    General: Normal vulva.     Pubic Area: No rash.      Labia:        Right: Lesion present. No rash or tenderness.        Left: Lesion present. No rash or tenderness.      Vagina: Normal. No vaginal discharge, erythema or tenderness.     Cervix: Normal.     Uterus: Normal. Not enlarged and not tender.      Adnexa: Right adnexa normal and left adnexa normal.       Right: No mass or tenderness.         Left: No mass or tenderness.      Musculoskeletal:        General: Normal range of motion.     Cervical back: Normal range of motion.  Skin:    General: Skin is warm and dry.  Neurological:     General: No focal deficit present.     Mental Status: She is alert and oriented to person, place, and time.  Psychiatric:        Mood and Affect: Mood normal.        Behavior: Behavior normal.        Thought Content: Thought content normal.        Judgment: Judgment normal.     Results: Results for orders placed or performed in visit on 05/31/20 (from the past 24 hour(s))  POCT Wet Prep with KOH     Status: Normal   Collection Time: 06/01/20 10:20 AM  Result Value Ref Range   Trichomonas, UA Negative    Clue Cells Wet Prep HPF POC neg    Epithelial Wet Prep HPF POC     Yeast Wet Prep HPF POC neg    Bacteria Wet Prep HPF POC     RBC Wet Prep HPF POC     WBC Wet Prep HPF POC     KOH Prep POC Negative Negative     Assessment/Plan: Vaginal lesion - Plan: valACYclovir (VALTREX) 1000 MG tablet, HSV NAA; Question herpes by clinical exam vs excoriations. Check culture, Rx valtrex in meantime. Will f/u with results. If culture neg, will check HSV 2 IgG. Topical oint prn.   Vaginal itching - Plan: POCT Wet Prep with KOH; neg wet prep. Due to lesions.    Meds ordered this encounter  Medications  . valACYclovir (VALTREX) 1000 MG tablet    Sig: Take 1 tablet (1,000 mg total) by  mouth 2 (two) times daily for 10 days.    Dispense:  20 tablet    Refill:  0    Order  Specific Question:   Supervising Provider    Answer:   Nadara Mustard [[id number]]      Return if symptoms worsen or fail to improve.  Diana Whitelaw B. Kendricks Reap, PA-C 06/01/2020 10:21 AM

## 2020-06-01 ENCOUNTER — Encounter: Payer: Self-pay | Admitting: Obstetrics and Gynecology

## 2020-06-01 LAB — POCT WET PREP WITH KOH
Clue Cells Wet Prep HPF POC: NEGATIVE
KOH Prep POC: NEGATIVE
Trichomonas, UA: NEGATIVE
Yeast Wet Prep HPF POC: NEGATIVE

## 2020-06-01 NOTE — Patient Instructions (Signed)
I value your feedback and entrusting us with your care. If you get a [facility] patient survey, I would appreciate you taking the time to let us know about your experience today. Thank you!  As of October 08, 2019, your lab results will be released to your MyChart immediately, before I even have a chance to see them. Please give me time to review them and contact you if there are any abnormalities. Thank you for your patience.  

## 2020-06-02 LAB — HSV NAA
HSV 1 NAA: NEGATIVE
HSV 2 NAA: NEGATIVE

## 2020-06-02 NOTE — Addendum Note (Signed)
Addended by: Althea Grimmer B on: 06/02/2020 05:08 PM   Modules accepted: Orders

## 2020-06-06 ENCOUNTER — Other Ambulatory Visit: Payer: 59

## 2020-06-06 ENCOUNTER — Other Ambulatory Visit: Payer: Self-pay

## 2020-06-06 DIAGNOSIS — N898 Other specified noninflammatory disorders of vagina: Secondary | ICD-10-CM

## 2020-06-07 ENCOUNTER — Telehealth: Payer: Self-pay | Admitting: Obstetrics and Gynecology

## 2020-06-07 DIAGNOSIS — N898 Other specified noninflammatory disorders of vagina: Secondary | ICD-10-CM

## 2020-06-07 LAB — HSV 2 ANTIBODY, IGG: HSV 2 IgG, Type Spec: <0.91 index (ref 0.00–0.90)

## 2020-06-07 MED ORDER — CLOTRIMAZOLE-BETAMETHASONE 1-0.05 % EX CREA: TOPICAL_CREAM | CUTANEOUS | 0 refills | Status: DC

## 2020-06-07 NOTE — Telephone Encounter (Signed)
Pt aware of neg HSV 2 IgG for ext vaginal lesions so must be excoriations. Still itching. Neg wet prep, lesions and itch ext bilat labia majora only. Try Rx lotrisone crm. F/u in 2 wks for bx if sx persist.

## 2020-06-08 ENCOUNTER — Other Ambulatory Visit: Payer: 59

## 2020-06-14 ENCOUNTER — Ambulatory Visit
Admission: RE | Admit: 2020-06-14 | Discharge: 2020-06-14 | Disposition: A | Discharge status: Home / Self Care | Payer: 59 | Source: Ambulatory Visit | Attending: Obstetrics and Gynecology | Admitting: Obstetrics and Gynecology

## 2020-06-14 ENCOUNTER — Other Ambulatory Visit: Payer: Self-pay

## 2020-06-14 DIAGNOSIS — Z1231 Encounter for screening mammogram for malignant neoplasm of breast: Secondary | ICD-10-CM | POA: Diagnosis present

## 2020-06-14 DIAGNOSIS — Z01419 Encounter for gynecological examination (general) (routine) without abnormal findings: Secondary | ICD-10-CM

## 2020-07-08 ENCOUNTER — Emergency Department
Admission: EM | Admit: 2020-07-08 | Discharge: 2020-07-08 | Disposition: A | Discharge status: Home / Self Care | Payer: 59 | Attending: Emergency Medicine | Admitting: Emergency Medicine

## 2020-07-08 ENCOUNTER — Other Ambulatory Visit: Payer: Self-pay

## 2020-07-08 ENCOUNTER — Emergency Department: Payer: 59

## 2020-07-08 DIAGNOSIS — R079 Chest pain, unspecified: Secondary | ICD-10-CM | POA: Diagnosis not present

## 2020-07-08 DIAGNOSIS — Z79899 Other long term (current) drug therapy: Secondary | ICD-10-CM | POA: Diagnosis not present

## 2020-07-08 DIAGNOSIS — Z7984 Long term (current) use of oral hypoglycemic drugs: Secondary | ICD-10-CM | POA: Insufficient documentation

## 2020-07-08 DIAGNOSIS — E119 Type 2 diabetes mellitus without complications: Secondary | ICD-10-CM | POA: Insufficient documentation

## 2020-07-08 DIAGNOSIS — R072 Precordial pain: Secondary | ICD-10-CM | POA: Diagnosis present

## 2020-07-08 LAB — CBC
HCT: 40.8 % (ref 36.0–46.0)
Hemoglobin: 14.7 g/dL (ref 12.0–15.0)
MCH: 33.1 pg (ref 26.0–34.0)
MCHC: 36 g/dL (ref 30.0–36.0)
MCV: 91.9 fL (ref 80.0–100.0)
Platelets: 227 10*3/uL (ref 150–400)
RBC: 4.44 MIL/uL (ref 3.87–5.11)
RDW: 12.5 % (ref 11.5–15.5)
WBC: 7.3 10*3/uL (ref 4.0–10.5)
nRBC: 0 % (ref 0.0–0.2)

## 2020-07-08 LAB — BASIC METABOLIC PANEL
Anion gap: 9 (ref 5–15)
BUN: 17 mg/dL (ref 8–23)
CO2: 27 mmol/L (ref 22–32)
Calcium: 9.2 mg/dL (ref 8.9–10.3)
Chloride: 102 mmol/L (ref 98–111)
Creatinine, Ser: 0.97 mg/dL (ref 0.44–1.00)
GFR calc Af Amer: >60 mL/min (ref >60)
GFR calc non Af Amer: >60 mL/min (ref >60)
Glucose, Bld: 99 mg/dL (ref 70–99)
Potassium: 4.5 mmol/L (ref 3.5–5.1)
Sodium: 138 mmol/L (ref 135–145)

## 2020-07-08 LAB — TROPONIN I (HIGH SENSITIVITY)
Troponin I (High Sensitivity): 4 ng/L (ref <18)
Troponin I (High Sensitivity): 4 ng/L (ref <18)

## 2020-07-08 NOTE — ED Triage Notes (Addendum)
Pt states that she started with center chest pain last pm, states that she is now having pain into her left armpit and into her left jaw, states that when she takes a deep breath she coughs and its painful,also states that she was vaccinated with pfizer in march

## 2020-07-08 NOTE — ED Provider Notes (Signed)
[facility] Emergency Department Provider Note   ____________________________________________   First MD Initiated Contact with Patient 07/08/20 1709     (approximate)  I have reviewed the triage vital signs and the nursing notes.   HISTORY  Chief Complaint Chest Pain    HPI Diana Zimmerman is a 59 y.o. female with a stated past medical history of depression and type 2 diabetes who presents for substernal chest pain that she describes as a cramping pain in her lower anterior chest wall that began approximately 24 hours prior to arrival and is associated with food.  Patient describes 4/10, nonradiating pain that was worsened after eating a spaghetti dinner with tomato sauce.  Patient endorses associated bilateral arm and jaw pain.  Patient denies exertional relation to this pain.  Patient does state that she has a positive family history for heart disease.  Patient denies any episodes of previous similar chest pain.  Patient states that this chest pain has almost fully resolved at this time.  Patient denies any relieving factors.         Past Medical History:  Diagnosis Date  . Chronic insomnia   . Depression   . Diabetes mellitus without complication (HCC)   . OSA (obstructive sleep apnea)    CPAP at home    Patient Active Problem List   Diagnosis Date Noted  . B12 deficiency 09/11/2019  . Insomnia 09/11/2019  . Depression 09/11/2019  . Hyperglycemia 09/11/2019  . Dermatochalasis of both upper eyelids 08/08/2017  . Acute right eye pain 07/10/2017  . Temporal pain 07/10/2017  . Sensory ataxia 06/13/2017  . Unsteady gait 06/13/2017  . Carpal tunnel syndrome, left upper limb 05/15/2017  . Numbness 05/08/2017  . Tingling 05/08/2017  . Severe obstructive sleep apnea 06/07/2016    Past Surgical History:  Procedure Laterality Date  . CESAREAN SECTION    . CHOLECYSTECTOMY, LAPAROSCOPIC  2013  . ENDOMETRIAL ABLATION  2016  . UTERINE FIBROID SURGERY       Prior to Admission medications   Medication Sig Start Date End Date Taking? Authorizing Provider  buPROPion (WELLBUTRIN XL) 150 MG 24 hr tablet Take 150 mg by mouth at bedtime. 02/17/20   [provider]  citalopram (CELEXA) 40 MG tablet Take by mouth. 09/13/16   [provider]  clotrimazole-betamethasone (LOTRISONE) cream Apply externally BID prn sx up to 2 wks 06/07/20   Copland, Ilona Sorrel, PA-C  metFORMIN (GLUCOPHAGE-XR) 750 MG 24 hr tablet  11/24/18   [provider]  Multiple Vitamin (MULTI-VITAMINS) TABS Take by mouth.    [provider]  traZODone (DESYREL) 50 MG tablet Take by mouth. 08/22/16 01/09/19  [provider]    Allergies Patient has no known allergies.  Family History  Problem Relation Age of Onset  . Colon cancer Mother   . Heart disease Father   . Diabetes Father   . Hypertension Father   . Diverticulitis Father   . Pancreatic cancer Paternal Uncle   . Throat cancer Paternal Uncle   . Breast cancer Neg Hx     Social History Social History   Tobacco Use  . Smoking status: Never Smoker  . Smokeless tobacco: Never Used  Vaping Use  . Vaping Use: Never used  Substance Use Topics  . Alcohol use: Yes    Comment: occasionally   . Drug use: Never    Review of Systems Constitutional: No fever/chills Eyes: No visual changes. ENT: No sore throat. Cardiovascular: Endorses chest  pain. Respiratory: Denies shortness of breath. Gastrointestinal: No abdominal pain.  No nausea, no vomiting.  No diarrhea. Genitourinary: Negative for dysuria. Musculoskeletal: Negative for acute arthralgias Skin: Negative for rash. Neurological: Negative for headaches, weakness/numbness/paresthesias in any extremity Psychiatric: Negative for suicidal ideation/homicidal ideation   ____________________________________________   PHYSICAL EXAM:  VITAL SIGNS: ED Triage Vitals  Enc Vitals Group     BP 07/08/20 1239 133/89     Pulse  Rate 07/08/20 1239 71     Resp 07/08/20 1239 18     Temp 07/08/20 1239 98.2 F (36.8 C)     Temp Source 07/08/20 1239 Oral     SpO2 07/08/20 1239 97 %     Weight 07/08/20 1240 198 lb (89.8 kg)     Height 07/08/20 1240 5\' 4"  (1.626 m)     Head Circumference --      Peak Flow --      Pain Score 07/08/20 1237 4     Pain Loc --      Pain Edu? --      Excl. in GC? --    Constitutional: Alert and oriented. Well appearing and in no acute distress. Eyes: Conjunctivae are normal. PERRL. EOMI. Head: Atraumatic. Nose: No congestion/rhinnorhea. Mouth/Throat: Mucous membranes are moist. Neck: No stridor Cardiovascular: Normal rate, regular rhythm. Grossly normal heart sounds.  Good peripheral circulation. Respiratory: Normal respiratory effort.  No retractions. Gastrointestinal: Soft and nontender. No distention. Musculoskeletal: No lower extremity tenderness nor edema.  No joint effusions. Neurologic:  Normal speech and language. No gross focal neurologic deficits are appreciated. Skin:  Skin is warm and dry. No rash noted. Psychiatric: Mood and affect are normal. Speech and behavior are normal.  ____________________________________________   LABS (all labs ordered are listed, but only abnormal results are displayed)  Labs Reviewed  BASIC METABOLIC PANEL  CBC  TROPONIN I (HIGH SENSITIVITY)  TROPONIN I (HIGH SENSITIVITY)   ____________________________________________  EKG  ED ECG REPORT I, 09/07/20, the attending physician, personally viewed and interpreted this ECG.  Date: 07/08/2020 EKG Time: 1232 Rate: 66 Rhythm: normal sinus rhythm QRS Axis: normal Intervals: normal ST/T Wave abnormalities: normal Narrative Interpretation: no evidence of acute ischemia  ____________________________________________  RADIOLOGY  ED MD interpretation: 2 view x-ray of the chest shows no evidence of acute abnormalities  Official radiology report(s): DG Chest 2 View  Result  Date: 07/08/2020 CLINICAL DATA:  Chest pain EXAM: CHEST - 2 VIEW COMPARISON:  None. FINDINGS: Lungs are clear. Heart size and pulmonary vascularity are normal. No adenopathy. No pneumothorax. No bone lesions. IMPRESSION: Lungs clear.  Cardiac silhouette normal. Electronically Signed   By: 09/07/2020 III M.D.   On: 07/08/2020 13:29    ____________________________________________   PROCEDURES  Procedure(s) performed (including Critical Care):  Procedures   ____________________________________________   INITIAL IMPRESSION / ASSESSMENT AND PLAN / ED COURSE        Workup: ECG, CXR, CBC, BMP, Troponin Findings: ECG: No overt evidence of STEMI. No evidence of Brugadas sign, delta wave, epsilon wave, significantly prolonged QTc, or malignant arrhythmia HS Troponin: Negative x1 Other Labs unremarkable for emergent problems. CXR: Without PTX, PNA, or widened mediastinum Last Stress Test: Never Last Heart Catheterization: Never HEART Score: 3  Given History, Exam, and Workup I have low suspicion for ACS, Pneumothorax, Pneumonia, Pulmonary Embolus, Tamponade, Aortic Dissection or other emergent problem as a cause for this presentation.   Reassesment: Prior to discharge patients pain was controlled and they were well appearing.  Disposition:  Discharge. Strict return precautions discussed with patient with full understanding. Advised patient to follow up promptly with primary care provider      ____________________________________________   FINAL CLINICAL IMPRESSION(S) / ED DIAGNOSES  Final diagnoses:  Nonspecific chest pain     ED Discharge Orders    None       Note:  This document was prepared using Dragon voice recognition software and may include unintentional dictation errors.   Merwyn Katos, MD 07/08/20 [phone]

## 2020-07-08 NOTE — ED Triage Notes (Signed)
First nurse note- here for chest pain. Pulled for EKG

## 2020-07-12 DIAGNOSIS — E66811 Obesity, class 1: Secondary | ICD-10-CM | POA: Insufficient documentation

## 2020-07-12 DIAGNOSIS — E669 Obesity, unspecified: Secondary | ICD-10-CM | POA: Insufficient documentation

## 2020-07-12 DIAGNOSIS — Z6834 Body mass index (BMI) 34.0-34.9, adult: Secondary | ICD-10-CM | POA: Insufficient documentation

## 2020-07-12 DIAGNOSIS — E119 Type 2 diabetes mellitus without complications: Secondary | ICD-10-CM | POA: Insufficient documentation

## 2020-09-29 ENCOUNTER — Other Ambulatory Visit
Admission: RE | Admit: 2020-09-29 | Discharge: 2020-09-29 | Disposition: A | Discharge status: Home / Self Care | Payer: 59 | Source: Ambulatory Visit | Attending: Gastroenterology | Admitting: Gastroenterology

## 2020-09-29 ENCOUNTER — Other Ambulatory Visit: Payer: Self-pay

## 2020-09-29 DIAGNOSIS — Z20822 Contact with and (suspected) exposure to covid-19: Secondary | ICD-10-CM | POA: Diagnosis not present

## 2020-09-29 DIAGNOSIS — Z01812 Encounter for preprocedural laboratory examination: Secondary | ICD-10-CM | POA: Diagnosis present

## 2020-09-29 LAB — SARS CORONAVIRUS 2 (TAT 6-24 HRS): SARS Coronavirus 2: NEGATIVE

## 2020-10-03 ENCOUNTER — Encounter: Payer: Self-pay | Admitting: *Deleted

## 2020-10-03 ENCOUNTER — Ambulatory Visit: Payer: 59 | Admitting: Anesthesiology

## 2020-10-03 ENCOUNTER — Ambulatory Visit
Admission: RE | Admit: 2020-10-03 | Discharge: 2020-10-03 | Disposition: A | Discharge status: Home / Self Care | Payer: 59 | Attending: Gastroenterology | Admitting: Gastroenterology

## 2020-10-03 ENCOUNTER — Encounter
Admission: RE | Disposition: A | Discharge status: Home / Self Care | Payer: Self-pay | Source: Home / Self Care | Attending: Gastroenterology

## 2020-10-03 DIAGNOSIS — Z800 Family history of malignant neoplasm of digestive organs: Secondary | ICD-10-CM | POA: Diagnosis not present

## 2020-10-03 DIAGNOSIS — Z7984 Long term (current) use of oral hypoglycemic drugs: Secondary | ICD-10-CM | POA: Diagnosis not present

## 2020-10-03 DIAGNOSIS — E119 Type 2 diabetes mellitus without complications: Secondary | ICD-10-CM | POA: Insufficient documentation

## 2020-10-03 DIAGNOSIS — R0789 Other chest pain: Secondary | ICD-10-CM | POA: Insufficient documentation

## 2020-10-03 DIAGNOSIS — R1310 Dysphagia, unspecified: Secondary | ICD-10-CM | POA: Insufficient documentation

## 2020-10-03 DIAGNOSIS — K317 Polyp of stomach and duodenum: Secondary | ICD-10-CM | POA: Diagnosis not present

## 2020-10-03 DIAGNOSIS — Z79899 Other long term (current) drug therapy: Secondary | ICD-10-CM | POA: Diagnosis not present

## 2020-10-03 DIAGNOSIS — K2100 Gastro-esophageal reflux disease with esophagitis, without bleeding: Secondary | ICD-10-CM | POA: Diagnosis not present

## 2020-10-03 HISTORY — PX: ESOPHAGOGASTRODUODENOSCOPY (EGD) WITH PROPOFOL: SHX5813

## 2020-10-03 LAB — GLUCOSE, CAPILLARY: Glucose-Capillary: 126 mg/dL — ABNORMAL HIGH (ref 70–99)

## 2020-10-03 SURGERY — ESOPHAGOGASTRODUODENOSCOPY (EGD) WITH PROPOFOL
Anesthesia: General

## 2020-10-03 MED ORDER — LIDOCAINE HCL (CARDIAC) PF 100 MG/5ML IV SOSY
PREFILLED_SYRINGE | INTRAVENOUS | Status: DC | PRN
  Administered 2020-10-03: 100 mg via INTRAVENOUS

## 2020-10-03 MED ORDER — SODIUM CHLORIDE 0.9 % IV SOLN
INTRAVENOUS | Status: DC
  Administered 2020-10-03: 20 mL/h via INTRAVENOUS

## 2020-10-03 MED ORDER — PROPOFOL 500 MG/50ML IV EMUL
INTRAVENOUS | Status: DC | PRN
  Administered 2020-10-03: 145 ug/kg/min via INTRAVENOUS

## 2020-10-03 MED ORDER — GLYCOPYRROLATE 0.2 MG/ML IJ SOLN
INTRAMUSCULAR | Status: DC | PRN
  Administered 2020-10-03: .2 mg via INTRAVENOUS

## 2020-10-03 MED ORDER — PROPOFOL 10 MG/ML IV BOLUS
INTRAVENOUS | Status: DC | PRN
  Administered 2020-10-03: 20 mg via INTRAVENOUS
  Administered 2020-10-03: 50 mg via INTRAVENOUS

## 2020-10-03 NOTE — H&P (Signed)
Outpatient short stay form Pre-procedure 10/03/2020 8:07 AM Merlyn Lot MD, MPH  Primary Physician: Dr. Greggory Stallion  Reason for visit:  Atypical Chest pain  History of present illness:   59 y/o lady with atypical chest pain with cardiac causes ruled out here for EGD. Describes symptoms as occasional dysphagia, esophageal tightening. Past week the episodes have not been happening but prior to this they were occurring daily. No blood thinners. Mother with history of colon cancer in her 31's. No neck surgeries.    Current Facility-Administered Medications:  .  0.9 %  sodium chloride infusion, , Intravenous, Continuous, Vershawn Westrup, Rossie Muskrat, MD, Last Rate: 20 mL/hr at 10/03/20 0805, 20 mL/hr at 10/03/20 0805  Medications Prior to Admission  Medication Sig Dispense Refill Last Dose  . buPROPion (WELLBUTRIN XL) 150 MG 24 hr tablet Take 150 mg by mouth at bedtime.   Past Week at Unknown time  . citalopram (CELEXA) 40 MG tablet Take by mouth.   Past Week at Unknown time  . clotrimazole-betamethasone (LOTRISONE) cream Apply externally BID prn sx up to 2 wks 15 g 0 Past Week at Unknown time  . isosorbide mononitrate (IMDUR) 30 MG 24 hr tablet Take 30 mg by mouth daily.   Past Week at Unknown time  . metFORMIN (GLUCOPHAGE-XR) 750 MG 24 hr tablet    10/02/2020 at Unknown time  . Multiple Vitamin (MULTI-VITAMINS) TABS Take by mouth.   Past Week at Unknown time  . pantoprazole (PROTONIX) 40 MG tablet Take 40 mg by mouth daily.   Past Week at Unknown time  . traZODone (DESYREL) 50 MG tablet Take by mouth.        No Known Allergies   Past Medical History:  Diagnosis Date  . Chronic insomnia   . Depression   . Diabetes mellitus without complication (HCC)   . OSA (obstructive sleep apnea)    CPAP at home    Review of systems:  Otherwise negative.    Physical Exam  Gen: Alert, oriented. Appears stated age.  HEENT:  PERRLA. Lungs: No respiratory distress CV: RRR Abd: soft, benign, no  masses. Ext: No edema.     Planned procedures: Proceed with EGD. The patient understands the nature of the planned procedure, indications, risks, alternatives and potential complications including but not limited to bleeding, infection, perforation, damage to internal organs and possible oversedation/side effects from anesthesia. The patient agrees and gives consent to proceed.  Please refer to procedure notes for findings, recommendations and patient disposition/instructions.     Merlyn Lot MD, MPH Gastroenterology 10/03/2020  8:07 AM

## 2020-10-03 NOTE — Transfer of Care (Signed)
Immediate Anesthesia Transfer of Care Note  Patient: Diana Zimmerman  Procedure(s) Performed: ESOPHAGOGASTRODUODENOSCOPY (EGD) WITH PROPOFOL (N/A )  Patient Location: Endoscopy Unit  Anesthesia Type:General  Level of Consciousness: drowsy, patient cooperative and responds to stimulation  Airway & Oxygen Therapy: Patient Spontanous Breathing and Patient connected to face mask oxygen  Post-op Assessment: Report given to RN and Post -op Vital signs reviewed and stable  Post vital signs: Reviewed and stable  Last Vitals:  Vitals Value Taken Time  BP 128/91 10/03/20 0835  Temp 36.3 C 10/03/20 0835  Pulse 88 10/03/20 0835  Resp 24 10/03/20 0835  SpO2 99 % 10/03/20 0835  Vitals shown include unvalidated device data.  Last Pain:  Vitals:   10/03/20 0835  TempSrc: Oral  PainSc:          Complications: No complications documented.

## 2020-10-03 NOTE — Anesthesia Preprocedure Evaluation (Signed)
Anesthesia Evaluation  Patient identified by MRN, date of birth, ID band Patient awake    Reviewed: Allergy & Precautions, NPO status , Patient's Chart, lab work & pertinent test results  Airway Mallampati: II  TM Distance: >3 FB     Dental   Pulmonary sleep apnea ,    Pulmonary exam normal        Cardiovascular Normal cardiovascular exam     Neuro/Psych  Headaches, PSYCHIATRIC DISORDERS Depression  Neuromuscular disease    GI/Hepatic negative GI ROS, Neg liver ROS,   Endo/Other  diabetes  Renal/GU negative Renal ROS  negative genitourinary   Musculoskeletal negative musculoskeletal ROS (+)   Abdominal Normal abdominal exam  (+)   Peds negative pediatric ROS (+)  Hematology negative hematology ROS (+)   Anesthesia Other Findings Past Medical History: No date: Chronic insomnia No date: Depression No date: Diabetes mellitus without complication (HCC) No date: OSA (obstructive sleep apnea)     Comment:  CPAP at home  Reproductive/Obstetrics                             Anesthesia Physical Anesthesia Plan  ASA: II  Anesthesia Plan: General   Post-op Pain Management:    Induction: Intravenous  PONV Risk Score and Plan: Propofol infusion  Airway Management Planned:   Additional Equipment:   Intra-op Plan:   Post-operative Plan:   Informed Consent: I have reviewed the patients History and Physical, chart, labs and discussed the procedure including the risks, benefits and alternatives for the proposed anesthesia with the patient or authorized representative who has indicated his/her understanding and acceptance.     Dental advisory given  Plan Discussed with: CRNA and Surgeon  Anesthesia Plan Comments:         Anesthesia Quick Evaluation

## 2020-10-03 NOTE — Anesthesia Procedure Notes (Signed)
Procedure Name: General with mask airway Performed by: Fletcher-Harrison, Tawana, CRNA Pre-anesthesia Checklist: Patient identified, Emergency Drugs available, Suction available and Patient being monitored Patient Re-evaluated:Patient Re-evaluated prior to induction Oxygen Delivery Method: Simple face mask Induction Type: IV induction Placement Confirmation: positive ETCO2 and CO2 detector Dental Injury: Teeth and Oropharynx as per pre-operative assessment        

## 2020-10-03 NOTE — Op Note (Signed)
[facility] Gastroenterology Patient Name: Diana Zimmerman Procedure Date: 10/03/2020 8:13 AM MRN: [id number] Account #: [id number] Date of Birth: 09/22/1961 Admit Type: Outpatient Age: 59 Room: [facility] ENDO ROOM 3 Gender: Female Note Status: Finalized Procedure:             Upper GI endoscopy Indications:           Gastro-esophageal reflux disease, Chest pain (non                         cardiac) Providers:             Andrey Farmer MD, MD Referring MD:          Rubbie Battiest. Iona Beard MD, MD (Referring MD) Medicines:             Monitored Anesthesia Care Complications:         No immediate complications. Estimated blood loss:                         Minimal. Procedure:             Pre-Anesthesia Assessment:                        - Prior to the procedure, a History and Physical was                         performed, and patient medications and allergies were                         reviewed. The patient is competent. The risks and                         benefits of the procedure and the sedation options and                         risks were discussed with the patient. All questions                         were answered and informed consent was obtained.                         Patient identification and proposed procedure were                         verified by the physician, the nurse, the anesthetist                         and the technician in the endoscopy suite. Mental                         Status Examination: alert and oriented. Airway                         Examination: normal oropharyngeal airway and neck                         mobility. Respiratory Examination: clear to  auscultation. CV Examination: normal. Prophylactic                         Antibiotics: The patient does not require prophylactic                         antibiotics. Prior Anticoagulants: The patient has                         taken no previous anticoagulant or  antiplatelet                         agents. ASA Grade Assessment: II - A patient with mild                         systemic disease. After reviewing the risks and                         benefits, the patient was deemed in satisfactory                         condition to undergo the procedure. The anesthesia                         plan was to use monitored anesthesia care (MAC).                         Immediately prior to administration of medications,                         the patient was re-assessed for adequacy to receive                         sedatives. The heart rate, respiratory rate, oxygen                         saturations, blood pressure, adequacy of pulmonary                         ventilation, and response to care were monitored                         throughout the procedure. The physical status of the                         patient was re-assessed after the procedure.                        After obtaining informed consent, the endoscope was                         passed under direct vision. Throughout the procedure,                         the patient's blood pressure, pulse, and oxygen                         saturations were monitored continuously. The Endoscope  was introduced through the mouth, and advanced to the                         second part of duodenum. The upper GI endoscopy was                         accomplished without difficulty. The patient tolerated                         the procedure well. Findings:      The examined esophagus was normal. Biopsies were obtained from the       proximal and distal esophagus with cold forceps for histology of       suspected eosinophilic esophagitis. Estimated blood loss was minimal.      Multiple 1 to 3 mm sessile polyps with no stigmata of recent bleeding       were found in the gastric fundus. Two polyps were sampled with a cold       biopsy forceps. Resection and retrieval were  complete. Estimated blood       loss was minimal.      The examined duodenum was normal. Impression:            - Normal esophagus. Biopsied.                        - Multiple gastric polyps. Resected and retrieved.                        - Normal examined duodenum. Recommendation:        - Discharge patient to home.                        - Resume previous diet.                        - Continue present medications.                        - Await pathology results.                        - Return to referring physician as previously                         scheduled. Procedure Code(s):     --- Professional ---                        [phone], Esophagogastroduodenoscopy, flexible,                         transoral; with biopsy, single or multiple Diagnosis Code(s):     --- Professional ---                        K31.7, Polyp of stomach and duodenum                        K21.9, Gastro-esophageal reflux disease without                         esophagitis  R07.89, Other chest pain CPT copyright 2019 American [employer] Association. All rights reserved. The codes documented in this report are preliminary and upon coder review may  be revised to meet current compliance requirements. Andrey Farmer, MD Andrey Farmer MD, MD 10/03/2020 8:39:11 AM Number of Addenda: 0 Note Initiated On: 10/03/2020 8:13 AM Estimated Blood Loss:  Estimated blood loss was minimal.      [facility]

## 2020-10-04 ENCOUNTER — Encounter: Payer: Self-pay | Admitting: Gastroenterology

## 2020-10-04 LAB — SURGICAL PATHOLOGY

## 2020-10-04 NOTE — Anesthesia Postprocedure Evaluation (Signed)
Anesthesia Post Note  Patient: Diana Zimmerman  Procedure(s) Performed: ESOPHAGOGASTRODUODENOSCOPY (EGD) WITH PROPOFOL (N/A )  Patient location during evaluation: Endoscopy Anesthesia Type: General Level of consciousness: awake and alert and oriented Pain management: pain level controlled Vital Signs Assessment: post-procedure vital signs reviewed and stable Respiratory status: spontaneous breathing Cardiovascular status: blood pressure returned to baseline Anesthetic complications: no   No complications documented.   Last Vitals:  Vitals:   10/03/20 0900 10/03/20 0910  BP: 133/89 133/89  Pulse: 74 74  Resp: 15 15  Temp:    SpO2: 97% 97%    Last Pain:  Vitals:   10/03/20 0835  TempSrc: Oral  PainSc:                  Ottavio Norem

## 2020-11-30 ENCOUNTER — Other Ambulatory Visit: Payer: Self-pay | Admitting: Gastroenterology

## 2020-11-30 DIAGNOSIS — R0789 Other chest pain: Secondary | ICD-10-CM

## 2020-12-06 ENCOUNTER — Other Ambulatory Visit: Payer: Self-pay

## 2020-12-06 ENCOUNTER — Ambulatory Visit
Admission: RE | Admit: 2020-12-06 | Discharge: 2020-12-06 | Disposition: A | Discharge status: Home / Self Care | Payer: 59 | Source: Ambulatory Visit | Attending: Gastroenterology | Admitting: Gastroenterology

## 2020-12-06 DIAGNOSIS — R0789 Other chest pain: Secondary | ICD-10-CM | POA: Insufficient documentation

## 2021-03-29 DIAGNOSIS — U071 COVID-19: Secondary | ICD-10-CM

## 2021-03-29 HISTORY — DX: COVID-19: U07.1

## 2021-04-01 ENCOUNTER — Other Ambulatory Visit: Payer: Self-pay

## 2021-04-01 ENCOUNTER — Encounter: Payer: Self-pay | Admitting: Emergency Medicine

## 2021-04-01 ENCOUNTER — Ambulatory Visit
Admission: EM | Admit: 2021-04-01 | Discharge: 2021-04-01 | Disposition: A | Discharge status: Home / Self Care | Payer: 59 | Attending: Emergency Medicine | Admitting: Emergency Medicine

## 2021-04-01 DIAGNOSIS — U071 COVID-19: Secondary | ICD-10-CM | POA: Diagnosis not present

## 2021-04-01 MED ORDER — BENZONATATE 100 MG PO CAPS: 200.0000 mg | ORAL_CAPSULE | Freq: Three times a day (TID) | ORAL | 0 refills | Status: DC

## 2021-04-01 MED ORDER — IPRATROPIUM BROMIDE 0.06 % NA SOLN: 2.0000 | Freq: Four times a day (QID) | NASAL | 12 refills | Status: DC

## 2021-04-01 MED ORDER — MOLNUPIRAVIR EUA 200MG CAPSULE: 4.0000 | ORAL_CAPSULE | Freq: Two times a day (BID) | ORAL | 0 refills | Status: AC

## 2021-04-01 MED ORDER — PROMETHAZINE-DM 6.25-15 MG/5ML PO SYRP: 5.0000 mL | ORAL_SOLUTION | Freq: Four times a day (QID) | ORAL | 0 refills | Status: DC | PRN

## 2021-04-01 NOTE — Discharge Instructions (Addendum)
You will have to quarantine for 5 days from the start of your symptoms.  After 5 days you can break quarantine if your symptoms have improved and you have not had a fever for 24 hours without taking Tylenol or ibuprofen.  Use over-the-counter Tylenol and ibuprofen as needed for body aches and fever.  Use the ipratropium nasal spray, 2 squirts up each nostril every 6 hours, as needed for nasal congestion and runny nose.  Take the molnupiravir, 4 capsules twice daily for 5 days, for treatment of your COVID-19.  Use the Tessalon Perles during the day as needed for cough and the Promethazine DM cough syrup at nighttime as will make you drowsy.  If you develop any increased shortness of breath-especially at rest, you are unable to speak in full sentences, or is a late sign your lips are turning blue you need to go the ER for evaluation.

## 2021-04-01 NOTE — ED Provider Notes (Signed)
[facility]    CSN: [id number] Arrival date & time: 04/01/21  0810      History   Chief Complaint Chief Complaint  Patient presents with  . Cough  . Nasal Congestion  . Covid Positive    HPI Diana QUIZHPI is a 60 y.o. female.   HPI   60 year old female here for evaluation after positive home COVID test.  Patient reports that she has had symptoms for the last 2 days and tested positive at home for COVID yesterday.  She reports that she had a fever with a T-max of 100.7, fatigue, ear pain, nasal congestion, and a nonproductive cough.  She reports that she does get short of breath when she has a coughing jag but not at baseline.  She denies wheezing, nausea or vomiting but has had some diarrhea.  Past Medical History:  Diagnosis Date  . Chronic insomnia   . COVID-19 03/2021  . Depression   . Diabetes mellitus without complication (HCC)   . OSA (obstructive sleep apnea)    CPAP at home    Patient Active Problem List   Diagnosis Date Noted  . B12 deficiency 09/11/2019  . Insomnia 09/11/2019  . Depression 09/11/2019  . Hyperglycemia 09/11/2019  . Dermatochalasis of both upper eyelids 08/08/2017  . Acute right eye pain 07/10/2017  . Temporal pain 07/10/2017  . Sensory ataxia 06/13/2017  . Unsteady gait 06/13/2017  . Carpal tunnel syndrome, left upper limb 05/15/2017  . Numbness 05/08/2017  . Tingling 05/08/2017  . Severe obstructive sleep apnea 06/07/2016    Past Surgical History:  Procedure Laterality Date  . CESAREAN SECTION    . CHOLECYSTECTOMY, LAPAROSCOPIC  2013  . ENDOMETRIAL ABLATION  2016  . ESOPHAGOGASTRODUODENOSCOPY (EGD) WITH PROPOFOL N/A 10/03/2020   Procedure: ESOPHAGOGASTRODUODENOSCOPY (EGD) WITH PROPOFOL;  Surgeon: Regis Bill, MD;  Location: [facility] ENDOSCOPY;  Service: Endoscopy;  Laterality: N/A;  . UTERINE FIBROID SURGERY      OB History    Gravida  1   Para  1   Term      Preterm  1   AB      Living  1     SAB       IAB      Ectopic      Multiple      Live Births  1            Home Medications    Prior to Admission medications   Medication Sig Start Date End Date Taking? Authorizing Provider  benzonatate (TESSALON) 100 MG capsule Take 2 capsules (200 mg total) by mouth every 8 (eight) hours. 04/01/21  Yes Becky Augusta, NP  buPROPion (WELLBUTRIN XL) 150 MG 24 hr tablet Take 150 mg by mouth at bedtime. 02/17/20  Yes [provider]  citalopram (CELEXA) 40 MG tablet Take by mouth. 09/13/16  Yes [provider]  ipratropium (ATROVENT) 0.06 % nasal spray Place 2 sprays into both nostrils 4 (four) times daily. 04/01/21  Yes Becky Augusta, NP  metFORMIN (GLUCOPHAGE-XR) 750 MG 24 hr tablet  11/24/18  Yes [provider]  molnupiravir EUA 200 mg CAPS Take 4 capsules (800 mg total) by mouth 2 (two) times daily for 5 days. 04/01/21 04/06/21 Yes Becky Augusta, NP  Multiple Vitamin (MULTI-VITAMINS) TABS Take by mouth.   Yes [provider]  promethazine-dextromethorphan (PROMETHAZINE-DM) 6.25-15 MG/5ML syrup Take 5 mLs by mouth 4 (four) times daily as needed. 04/01/21  Yes Becky Augusta, NP  traZODone (  DESYREL) 50 MG tablet Take by mouth. 08/22/16 04/01/21 Yes [provider]  clotrimazole-betamethasone (LOTRISONE) cream Apply externally BID prn sx up to 2 wks 06/07/20   Copland, Alicia B, PA-C  isosorbide mononitrate (IMDUR) 30 MG 24 hr tablet Take 30 mg by mouth daily.    [provider]  pantoprazole (PROTONIX) 40 MG tablet Take 40 mg by mouth daily.    [provider]    Family History Family History  Problem Relation Age of Onset  . Colon cancer Mother   . Heart disease Father   . Diabetes Father   . Hypertension Father   . Diverticulitis Father   . Pancreatic cancer Paternal Uncle   . Throat cancer Paternal Uncle   . Breast cancer Neg Hx     Social History Social History   Tobacco Use  . Smoking status: Never Smoker  . Smokeless tobacco:  Never Used  Vaping Use  . Vaping Use: Never used  Substance Use Topics  . Alcohol use: Yes    Comment: occasionally   . Drug use: Never     Allergies   Patient has no known allergies.   Review of Systems Review of Systems  Constitutional: Positive for fatigue and fever. Negative for activity change and appetite change.  HENT: Positive for congestion, ear pain, rhinorrhea and sore throat.   Respiratory: Positive for cough. Negative for shortness of breath and wheezing.   Gastrointestinal: Positive for diarrhea. Negative for nausea and vomiting.  Skin: Negative for rash.  Hematological: Negative.   Psychiatric/Behavioral: Negative.      Physical Exam Triage Vital Signs ED Triage Vitals  Enc Vitals Group     BP 04/01/21 0824 113/77     Pulse Rate 04/01/21 0824 89     Resp 04/01/21 0824 18     Temp 04/01/21 0824 98.7 F (37.1 C)     Temp Source 04/01/21 0824 Oral     SpO2 04/01/21 0824 98 %     Weight 04/01/21 0822 200 lb (90.7 kg)     Height 04/01/21 0822 5\' 4"  (1.626 m)     Head Circumference --      Peak Flow --      Pain Score 04/01/21 0822 0     Pain Loc --      Pain Edu? --      Excl. in GC? --    No data found.  Updated Vital Signs BP 113/77 (BP Location: Left Arm)   Pulse 89   Temp 98.7 F (37.1 C) (Oral)   Resp 18   Ht 5\' 4"  (1.626 m)   Wt 200 lb (90.7 kg)   SpO2 98%   BMI 34.33 kg/m   Visual Acuity Right Eye Distance:   Left Eye Distance:   Bilateral Distance:    Right Eye Near:   Left Eye Near:    Bilateral Near:     Physical Exam Vitals and nursing note reviewed.  Constitutional:      General: She is not in acute distress.    Appearance: Normal appearance.  HENT:     Head: Normocephalic and atraumatic.     Right Ear: Tympanic membrane, ear canal and external ear normal. There is no impacted cerumen.     Left Ear: Tympanic membrane, ear canal and external ear normal. There is no impacted cerumen.     Nose: Congestion and rhinorrhea  present.     Mouth/Throat:     Mouth: Mucous membranes are moist.  Pharynx: Oropharynx is clear.  Cardiovascular:     Rate and Rhythm: Normal rate and regular rhythm.     Pulses: Normal pulses.     Heart sounds: Normal heart sounds. No murmur heard. No gallop.   Pulmonary:     Effort: Pulmonary effort is normal.     Breath sounds: Normal breath sounds. No wheezing, rhonchi or rales.  Musculoskeletal:     Cervical back: Normal range of motion and neck supple.  Lymphadenopathy:     Cervical: No cervical adenopathy.  Skin:    General: Skin is warm and dry.     Capillary Refill: Capillary refill takes less than 2 seconds.     Findings: No erythema or rash.  Neurological:     General: No focal deficit present.     Mental Status: She is alert and oriented to person, place, and time.  Psychiatric:        Mood and Affect: Mood normal.        Behavior: Behavior normal.        Thought Content: Thought content normal.        Judgment: Judgment normal.      UC Treatments / Results  Labs (all labs ordered are listed, but only abnormal results are displayed) Labs Reviewed - No data to display  EKG   Radiology No results found.  Procedures Procedures (including critical care time)  Medications Ordered in UC Medications - No data to display  Initial Impression / Assessment and Plan / UC Course  I have reviewed the triage vital signs and the nursing notes.  Pertinent labs & imaging results that were available during my care of the patient were reviewed by me and considered in my medical decision making (see chart for details).   60 year old female here for evaluation after testing COVID-positive at home with symptoms as outlined in HPI above.  Physical exam reveals pearly gray tympanic membranes bilaterally with normal light reflex and clear external auditory canals.  Nasal mucosa is erythematous and edematous with clear nasal discharge.  Oropharyngeal exam reveals posterior  oropharyngeal erythema with cobblestoning and clear postnasal drip.  No cervical lymphadenopathy appreciated exam.  Cardiopulmonary exam is benign.  Patient has a history of esophageal spasms for which she is taking Imdur she also has a history of diabetes.  Patient's most recent chemistry is from 07/08/2020 so without having a recent GFR will prescribe molnupiravir twice daily for 5 days for treatment of her COVID-19, ipratropium nasal spray to help with nasal congestion, Tessalon Perles and Promethazine DM cough syrup to help with cough and congestion.  ER precautions reviewed with patient.  Patient states that she is did not need a work note.   Final Clinical Impressions(s) / UC Diagnoses   Final diagnoses:  COVID-19     Discharge Instructions     You will have to quarantine for 5 days from the start of your symptoms.  After 5 days you can break quarantine if your symptoms have improved and you have not had a fever for 24 hours without taking Tylenol or ibuprofen.  Use over-the-counter Tylenol and ibuprofen as needed for body aches and fever.  Use the ipratropium nasal spray, 2 squirts up each nostril every 6 hours, as needed for nasal congestion and runny nose.  Take the molnupiravir, 4 capsules twice daily for 5 days, for treatment of your COVID-19.  Use the Tessalon Perles during the day as needed for cough and the Promethazine DM cough syrup at nighttime  as will make you drowsy.  If you develop any increased shortness of breath-especially at rest, you are unable to speak in full sentences, or is a late sign your lips are turning blue you need to go the ER for evaluation.     ED Prescriptions    Medication Sig Dispense Auth. Provider   ipratropium (ATROVENT) 0.06 % nasal spray Place 2 sprays into both nostrils 4 (four) times daily. 15 mL Becky Augustayan, Jerrelle Michelsen, NP   benzonatate (TESSALON) 100 MG capsule Take 2 capsules (200 mg total) by mouth every 8 (eight) hours. 21 capsule Becky Augustayan, Renu Asby, NP    promethazine-dextromethorphan (PROMETHAZINE-DM) 6.25-15 MG/5ML syrup Take 5 mLs by mouth 4 (four) times daily as needed. 118 mL Becky Augustayan, Tailor Westfall, NP   molnupiravir EUA 200 mg CAPS Take 4 capsules (800 mg total) by mouth 2 (two) times daily for 5 days. 40 capsule Becky Augustayan, Atreyu Mak, NP     PDMP not reviewed this encounter.   Becky Augustayan, Rustin Erhart, NP 04/01/21 [phone]

## 2021-04-01 NOTE — ED Triage Notes (Signed)
Pt c/o COVID positive test at-home yesterday. Pt reports cough, nasal congestion, fatigue, ear pain and fever (100.7) since Thursday. Pt has not taken any medications for her symptoms. Pt reports her parents are also both positive.

## 2021-11-13 ENCOUNTER — Encounter: Payer: Self-pay | Admitting: Licensed Practical Nurse

## 2021-11-13 ENCOUNTER — Ambulatory Visit (INDEPENDENT_AMBULATORY_CARE_PROVIDER_SITE_OTHER): Payer: 59 | Admitting: Licensed Practical Nurse

## 2021-11-13 ENCOUNTER — Other Ambulatory Visit: Payer: Self-pay

## 2021-11-13 ENCOUNTER — Other Ambulatory Visit (HOSPITAL_COMMUNITY)
Admission: RE | Admit: 2021-11-13 | Discharge: 2021-11-13 | Disposition: A | Discharge status: Home / Self Care | Payer: 59 | Source: Ambulatory Visit | Attending: Licensed Practical Nurse | Admitting: Licensed Practical Nurse

## 2021-11-13 VITALS — BP 104/66 | Ht 64.0 in | Wt 203.0 lb

## 2021-11-13 DIAGNOSIS — N9089 Other specified noninflammatory disorders of vulva and perineum: Secondary | ICD-10-CM | POA: Diagnosis present

## 2021-11-13 DIAGNOSIS — N952 Postmenopausal atrophic vaginitis: Secondary | ICD-10-CM | POA: Diagnosis not present

## 2021-11-13 DIAGNOSIS — R399 Unspecified symptoms and signs involving the genitourinary system: Secondary | ICD-10-CM

## 2021-11-13 LAB — POCT URINALYSIS DIPSTICK
Bilirubin, UA: NEGATIVE
Blood, UA: NEGATIVE
Glucose, UA: NEGATIVE
Ketones, UA: NEGATIVE
Nitrite, UA: NEGATIVE
Protein, UA: POSITIVE — AB
Spec Grav, UA: >=1.03 — AB (ref 1.010–1.025)
Urobilinogen, UA: 0.2 E.U./dL
pH, UA: 5 (ref 5.0–8.0)

## 2021-11-13 MED ORDER — CLOTRIMAZOLE-BETAMETHASONE 1-0.05 % EX CREA: TOPICAL_CREAM | Freq: Two times a day (BID) | CUTANEOUS | Status: AC

## 2021-11-13 NOTE — Progress Notes (Signed)
VULVAR BIOPSY NOTE The indications for vulvar biopsy (rule out neoplasia, establish lichen sclerosus diagnosis) were reviewed.   Risks of the biopsy including pain, bleeding, infection, inadequate specimen, scarring and need for additional procedures  were discussed. The patient stated understanding and agreed to undergo procedure today.    The patient's vulva was prepped with Betadine. 1% lidocaine was injected into area of concern. A 3 -mm punch biopsy was done, biopsy tissue was picked up with sterile forceps and sterile scissors were used to excise the lesion.  Small bleeding was noted and hemostasis was achieved using silver nitrate sticks.  The patient tolerated the procedure well. Post-procedure instructions  (pelvic rest for one week) were given to the patient. The patient is to call with heavy bleeding, fever greater than 100.4, foul smelling vaginal discharge or other concerns.   Barnett Applebaum, MD, [facility], [facility] 11/13/2021  5:05 PM

## 2021-11-14 NOTE — Progress Notes (Signed)
°  HPI:      [name] is a 61 y.o. G1P0101 who is postmenopausal, presents today for a problem visit.  She complains of vaginal dryness, vaginal itching, and concern for a UTI.   Symptoms have been present for 1 months. When the itching occurs, she scratches and gets swollen, she wakes up in the middle of the night itching.  Being exposed to air or just after urination the itching is worse. Symptoms are mod to severe and has led her to come in today to seek options for intervention. Reports her urine has an odor and it has been cloudy.  She has had 4 UTI's in the last 8 months.  Denies any dysuria or frequency but does have urgency. Denies any fevers. Has had low back pain for the last couple of weeks-no flank pain.  Denies any changes to vaginal discharge, has not had IC in a few moths.  Previous Treatment: Vagisil cream. Has used a steroid cream in the past.   Denies PostMenopausal Bleeding  PMHx: She  has a past medical history of Chronic insomnia, COVID-19 (03/2021), Depression, Diabetes mellitus without complication ([place]), and OSA (obstructive sleep apnea). Also,  has a past surgical history that includes Uterine fibroid surgery; Cesarean section; Cholecystectomy, laparoscopic (2013); Endometrial ablation (2016); and Esophagogastroduodenoscopy (egd) with propofol (N/A, 10/03/2020)., family history includes Colon cancer in her mother; Diabetes in her father; Diverticulitis in her father; Heart disease in her father; Hypertension in her father; Pancreatic cancer in her paternal uncle; Throat cancer in her paternal uncle.,  reports that she has never smoked. She has never used smokeless tobacco. She reports current alcohol use. She reports that she does not use drugs.  She has a current medication list which includes the following prescription(s): atorvastatin, bupropion, citalopram, isosorbide mononitrate, metformin, multi-vitamins, and trazodone, and the following Facility-Administered  Medications: clotrimazole-betamethasone. Also, has No Known Allergies.  ROS see above   Objective: BP 104/66    Ht 5\' 4"  (1.626 m)    Wt 203 lb (92.1 kg)    BMI 34.84 kg/m  Physical Exam Genitourinary:     Genitourinary Comments: Vulva and mons pubis excoriated and red   Thickened white skin noted under fold of labia-see diagram      Pulmonary:     Effort: Pulmonary effort is normal.  Neurological:     Mental Status: She is alert.    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  Follow up based on biopsy results  Roberto Scales, [place], [facility]  11/14/21  1:42 PM

## 2021-11-17 ENCOUNTER — Encounter: Payer: Self-pay | Admitting: Licensed Practical Nurse

## 2021-11-17 ENCOUNTER — Other Ambulatory Visit: Payer: Self-pay | Admitting: Licensed Practical Nurse

## 2021-11-17 DIAGNOSIS — N390 Urinary tract infection, site not specified: Secondary | ICD-10-CM

## 2021-11-17 LAB — URINE CULTURE

## 2021-11-17 LAB — SURGICAL PATHOLOGY

## 2021-11-17 MED ORDER — SULFAMETHOXAZOLE-TRIMETHOPRIM 800-160 MG PO TABS: 1.0000 | ORAL_TABLET | Freq: Two times a day (BID) | ORAL | 1 refills | Status: DC

## 2021-11-17 NOTE — Progress Notes (Signed)
Urine culture shows UTI Mobile number not accepting calls LVM on home number to check mychart Mychart message sent  Script for Marcrobid sent to pharmacy on file.  Carie Caddy, CNM  [facility], [facility]  11/17/21  1:10 PM

## 2021-11-29 ENCOUNTER — Ambulatory Visit (INDEPENDENT_AMBULATORY_CARE_PROVIDER_SITE_OTHER): Payer: 59 | Admitting: Advanced Practice Midwife

## 2021-11-29 ENCOUNTER — Encounter: Payer: Self-pay | Admitting: Advanced Practice Midwife

## 2021-11-29 ENCOUNTER — Other Ambulatory Visit: Payer: Self-pay

## 2021-11-29 VITALS — BP 123/80 | Ht 64.0 in | Wt 200.0 lb

## 2021-11-29 DIAGNOSIS — Z1239 Encounter for other screening for malignant neoplasm of breast: Secondary | ICD-10-CM

## 2021-11-29 DIAGNOSIS — E669 Obesity, unspecified: Secondary | ICD-10-CM | POA: Insufficient documentation

## 2021-11-29 DIAGNOSIS — Z0000 Encounter for general adult medical examination without abnormal findings: Secondary | ICD-10-CM | POA: Diagnosis not present

## 2021-11-29 NOTE — Progress Notes (Signed)
Gynecology Annual Exam  PCP: Sharyne Peach, MD  Chief Complaint:  Chief Complaint  Patient presents with   Annual Exam    History of Present Illness:Patient is a 61 y.o. G1P0101 presents for annual exam. The patient has no gyn complaints today. She was seen recently for UTI and vaginal itching. Both have resolved.  LMP: No LMP recorded. Patient has had an ablation. Postmenopausal   The patient is sexually active. She denies dyspareunia.  The patient does occasionally perform self breast exams.  There is no notable family history of breast or ovarian cancer in her family.  The patient wears seatbelts: yes.   The patient has regular exercise: she broke 2 bones in her right foot from a fall in December and has decreased exercise since then. She usually walks 2 days per week. She reports good diet. She takes calcium and vitamin D supplements. She admits needing to increase water intake. She drinks diet drinks otherwise. She usually sleeps 4-5 hours- chronic insomnia (diagnosed with sleep apnea and she does not like to wear mask). She takes trazodone. She is hoping to lose some weight to be eligible for non-mask treatment.   The patient denies current symptoms of depression.  Current medications are controlling symptoms.   Review of Systems: Review of Systems  Constitutional:  Negative for chills and fever.  HENT:  Negative for congestion, ear discharge, ear pain, hearing loss, sinus pain and sore throat.   Eyes:  Negative for blurred vision and double vision.  Respiratory:  Negative for cough, shortness of breath and wheezing.   Cardiovascular:  Negative for chest pain, palpitations and leg swelling.  Gastrointestinal:  Negative for abdominal pain, blood in stool, constipation, diarrhea, heartburn, melena, nausea and vomiting.  Genitourinary:  Negative for dysuria, flank pain, frequency, hematuria and urgency.  Musculoskeletal:  Negative for back pain, joint pain and myalgias.   Skin:  Negative for itching and rash.  Neurological:  Negative for dizziness, tingling, tremors, sensory change, speech change, focal weakness, seizures, loss of consciousness, weakness and headaches.  Endo/Heme/Allergies:  Negative for environmental allergies. Does not bruise/bleed easily.  Psychiatric/Behavioral:  Negative for depression, hallucinations, memory loss, substance abuse and suicidal ideas. The patient has insomnia. The patient is not nervous/anxious.    Past Medical History:  Patient Active Problem List   Diagnosis Date Noted   Obesity, Class I, BMI 30-34.9 11/29/2021   Type 2 diabetes mellitus ([place]) 07/12/2020   Insomnia 09/11/2019   Depression 09/11/2019   Hyperglycemia 09/11/2019    Overview:  mild    Temporal pain 07/10/2017   Sensory ataxia 06/13/2017   Unsteady gait 06/13/2017   Carpal tunnel syndrome, left upper limb 05/15/2017   Tingling 05/08/2017   Severe obstructive sleep apnea 06/07/2016    Past Surgical History:  Past Surgical History:  Procedure Laterality Date   CESAREAN SECTION     CHOLECYSTECTOMY, LAPAROSCOPIC  2013   ENDOMETRIAL ABLATION  2016   ESOPHAGOGASTRODUODENOSCOPY (EGD) WITH PROPOFOL N/A 10/03/2020   Procedure: ESOPHAGOGASTRODUODENOSCOPY (EGD) WITH PROPOFOL;  Surgeon: Lesly Rubenstein, MD;  Location: [facility] ENDOSCOPY;  Service: Endoscopy;  Laterality: N/A;   UTERINE FIBROID SURGERY      Gynecologic History:  No LMP recorded. Patient has had an ablation. Last Pap: 3.5 years ago Results were:  no abnormalities  Last mammogram: 1.5 years ago Results were: BI-RAD I  Obstetric History: G52P0101  Family History:  Family History  Problem Relation Age of Onset   Colon cancer Mother  Heart disease Father    Diabetes Father    Hypertension Father    Diverticulitis Father    Pancreatic cancer Paternal Uncle    Throat cancer Paternal Uncle    Breast cancer Neg Hx     Social History:  Social History   Socioeconomic History    Marital status: Married    Spouse name: Not on file   Number of children: Not on file   Years of education: Not on file   Highest education level: Not on file  Occupational History   Not on file  Tobacco Use   Smoking status: Never   Smokeless tobacco: Never  Vaping Use   Vaping Use: Never used  Substance and Sexual Activity   Alcohol use: Yes    Comment: occasionally    Drug use: Never   Sexual activity: Not Currently    Birth control/protection: Surgical    Comment: Ablation  Other Topics Concern   Not on file  Social History Narrative   Not on file   Social Determinants of Health   Financial Resource Strain: Not on file  Food Insecurity: Not on file  Transportation Needs: Not on file  Physical Activity: Not on file  Stress: Not on file  Social Connections: Not on file  Intimate Partner Violence: Not on file    Allergies:  No Known Allergies  Medications: Prior to Admission medications   Medication Sig Start Date End Date Taking? Authorizing Provider  buPROPion (WELLBUTRIN XL) 150 MG 24 hr tablet Take 150 mg by mouth at bedtime. 02/17/20  Yes [provider]  citalopram (CELEXA) 40 MG tablet Take by mouth. 09/13/16  Yes [provider]  metFORMIN (GLUCOPHAGE-XR) 750 MG 24 hr tablet  11/24/18  Yes [provider]  Multiple Vitamin (MULTI-VITAMINS) TABS Take by mouth.   Yes [provider]  traZODone (DESYREL) 100 MG tablet  10/26/21  Yes [provider]    Physical Exam Vitals: Blood pressure 123/80, height 5\' 4"  (1.626 m), weight 200 lb (90.7 kg).  General: NAD HEENT: normocephalic, anicteric Thyroid: no enlargement, no palpable nodules Pulmonary: No increased work of breathing, CTAB Cardiovascular: RRR, distal pulses 2+ Breast: Breast symmetrical, no tenderness, no palpable nodules or masses, no skin or nipple retraction present, no nipple discharge.  No axillary or supraclavicular lymphadenopathy. Abdomen: NABS,  soft, non-tender, non-distended.  Umbilicus without lesions.  No hepatomegaly, splenomegaly or masses palpable. No evidence of hernia  Genitourinary: deferred for no concerns/PAP interval Extremities: no edema, erythema, or tenderness Neurologic: Grossly intact Psychiatric: mood appropriate, affect full    Assessment: 61 y.o. G1P0101 routine annual exam  Plan: Problem List Items Addressed This Visit   None Visit Diagnoses     Well woman exam without gynecological exam    -  Primary   Breast screening       Relevant Orders   MM 3D SCREEN BREAST BILATERAL       1) Mammogram - recommend yearly screening mammogram.  Mammogram Was ordered today  2) STI screening  was offered and declined  3) ASCCP guidelines and rationale discussed.  Patient opts for every 5 years screening interval  4) Osteoporosis  - per USPTF routine screening DEXA at age 13  Consider FDA-approved medical therapies in postmenopausal women and men aged 54 years and older, based on the following: a) A hip or vertebral (clinical or morphometric) fracture b) T-score ? -2.5 at the femoral neck or spine after appropriate evaluation to exclude secondary causes C)  Low bone mass (T-score between -1.0 and -2.5 at the femoral neck or spine) and a 10-year probability of a hip fracture ? 3% or a 10-year probability of a major osteoporosis-related fracture ? 20% based on the [place]-adapted WHO algorithm   5) Routine healthcare maintenance including cholesterol, diabetes screening discussed managed by PCP  6) Colonoscopy due in 2025.  Screening recommended starting at age 34 for average risk individuals, age 83 for individuals deemed at increased risk (including African Americans) and recommended to continue until age 64.  For patient age 39-85 individualized approach is recommended.  Gold standard screening is via colonoscopy, Cologuard screening is an acceptable alternative for patient unwilling or unable to undergo colonoscopy.   "Colorectal cancer screening for average?risk adults: 2018 guideline update from the American Cancer Society"[place]: A Cancer Journal for Clinicians: Mar 27, 2017   7) Return in about 1 year (around 11/29/2022) for annual established gyn.   Christean Leaf, CNM [facility] 11/29/21, 11:48 AM

## 2022-01-23 ENCOUNTER — Ambulatory Visit
Admission: RE | Admit: 2022-01-23 | Discharge: 2022-01-23 | Disposition: A | Discharge status: Home / Self Care | Payer: 59 | Source: Ambulatory Visit | Attending: Advanced Practice Midwife | Admitting: Advanced Practice Midwife

## 2022-01-23 ENCOUNTER — Other Ambulatory Visit: Payer: Self-pay

## 2022-01-23 DIAGNOSIS — Z1231 Encounter for screening mammogram for malignant neoplasm of breast: Secondary | ICD-10-CM | POA: Insufficient documentation

## 2022-01-23 DIAGNOSIS — Z1239 Encounter for other screening for malignant neoplasm of breast: Secondary | ICD-10-CM

## 2022-11-10 IMAGING — MG MM DIGITAL SCREENING BILAT W/ TOMO AND CAD
6 of 12 series · 6 of 36 positions shown · non-contrast
Comparison: Previous exam(s).

CLINICAL DATA: Screening.

EXAM:
DIGITAL SCREENING BILATERAL MAMMOGRAM WITH TOMOSYNTHESIS AND CAD
TECHNIQUE: Bilateral screening digital craniocaudal and mediolateral oblique
mammograms were obtained. Bilateral screening digital breast
tomosynthesis was performed. The images were evaluated with
computer-aided detection.

[L MLO synth-2D (1 of 2)]
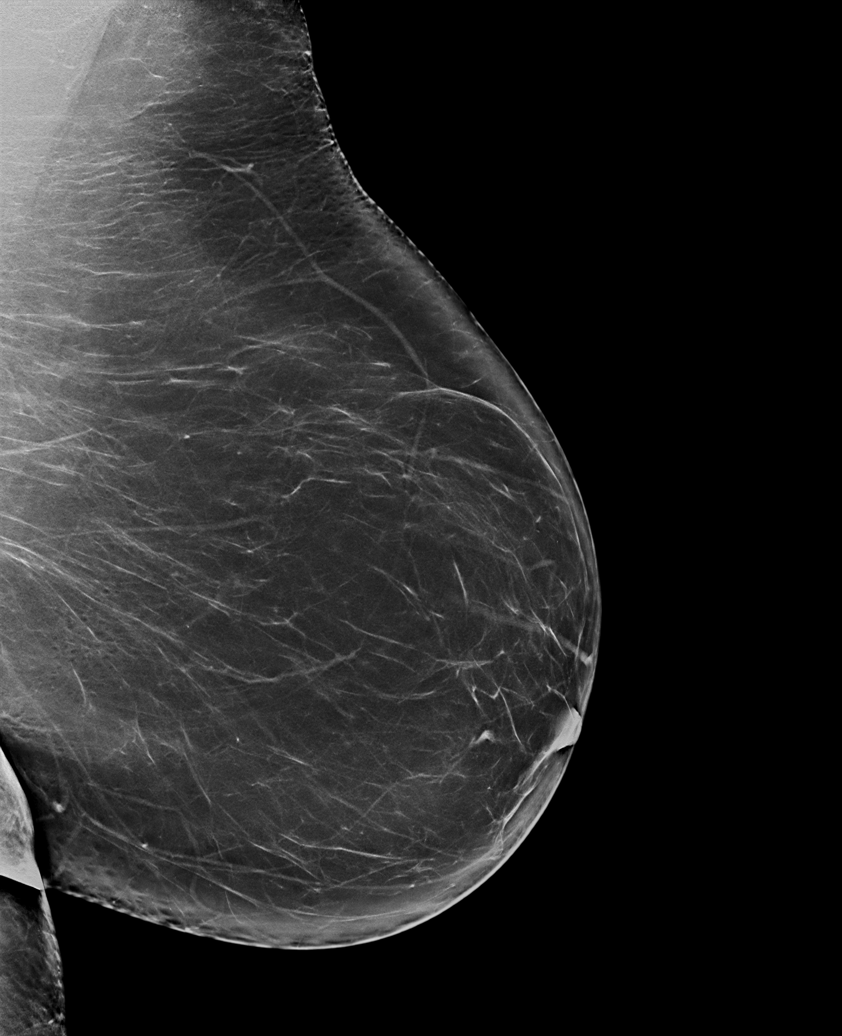

[R MLO synth-2D]
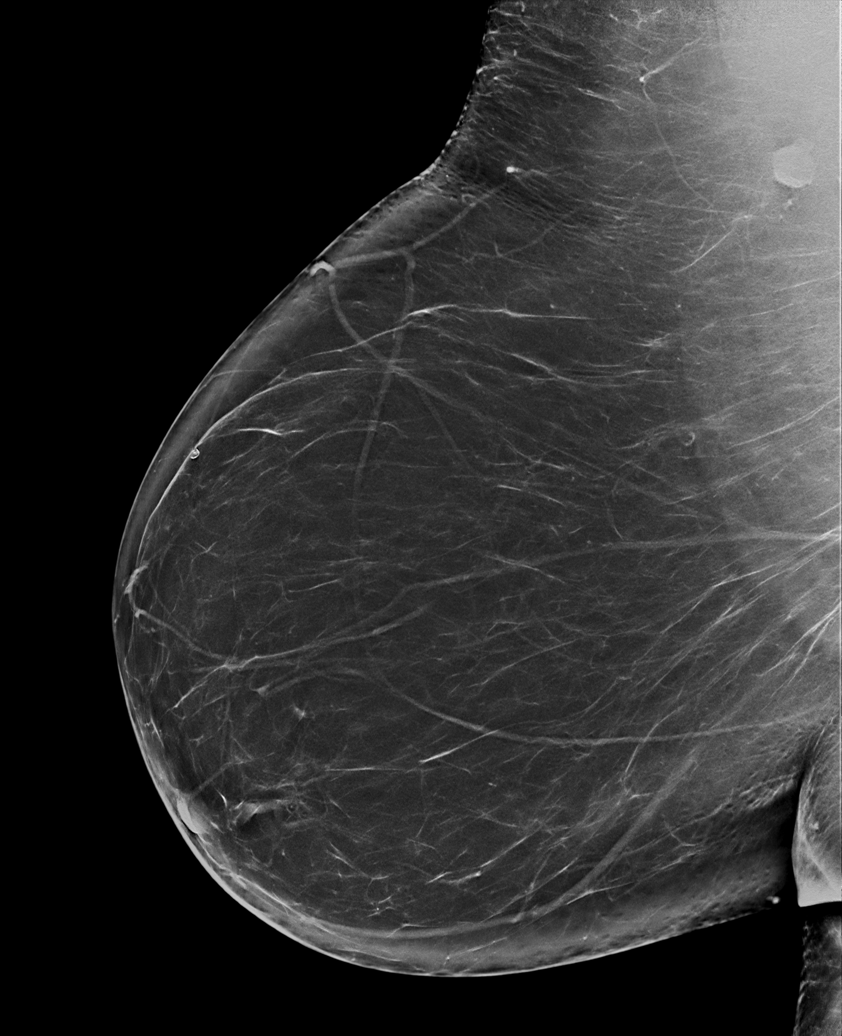

[L MLO synth-2D (2 of 2)]
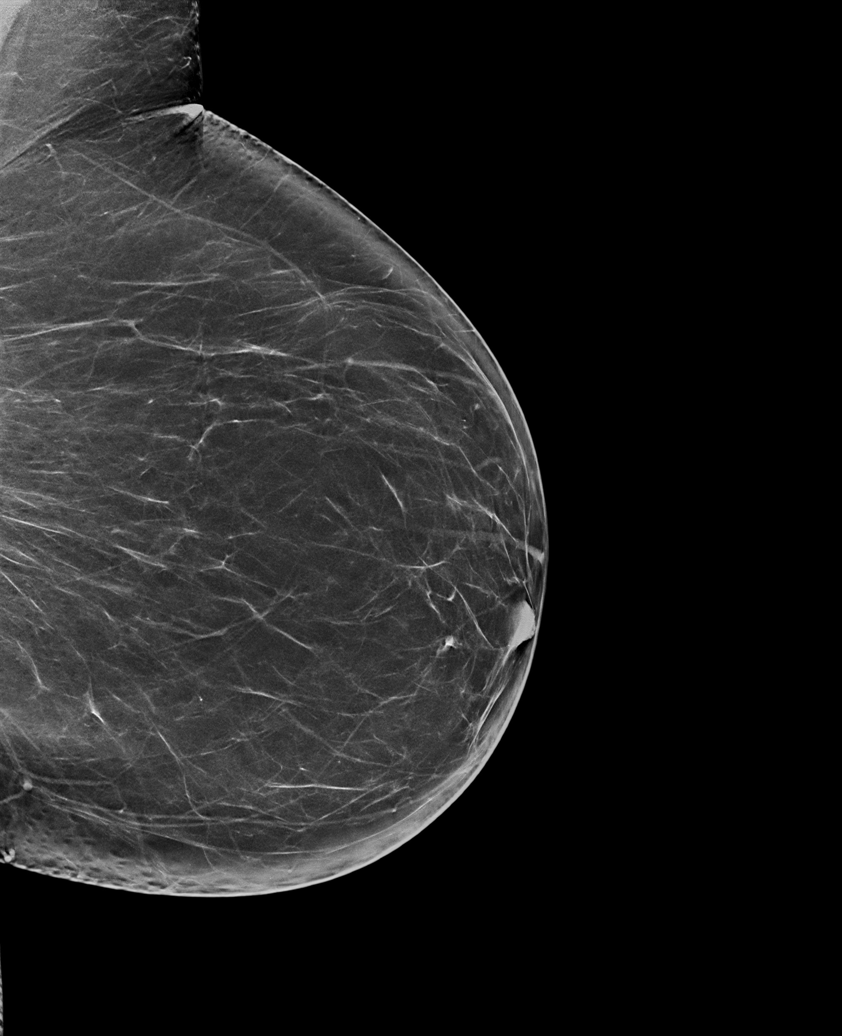

[L CC synth-2D]
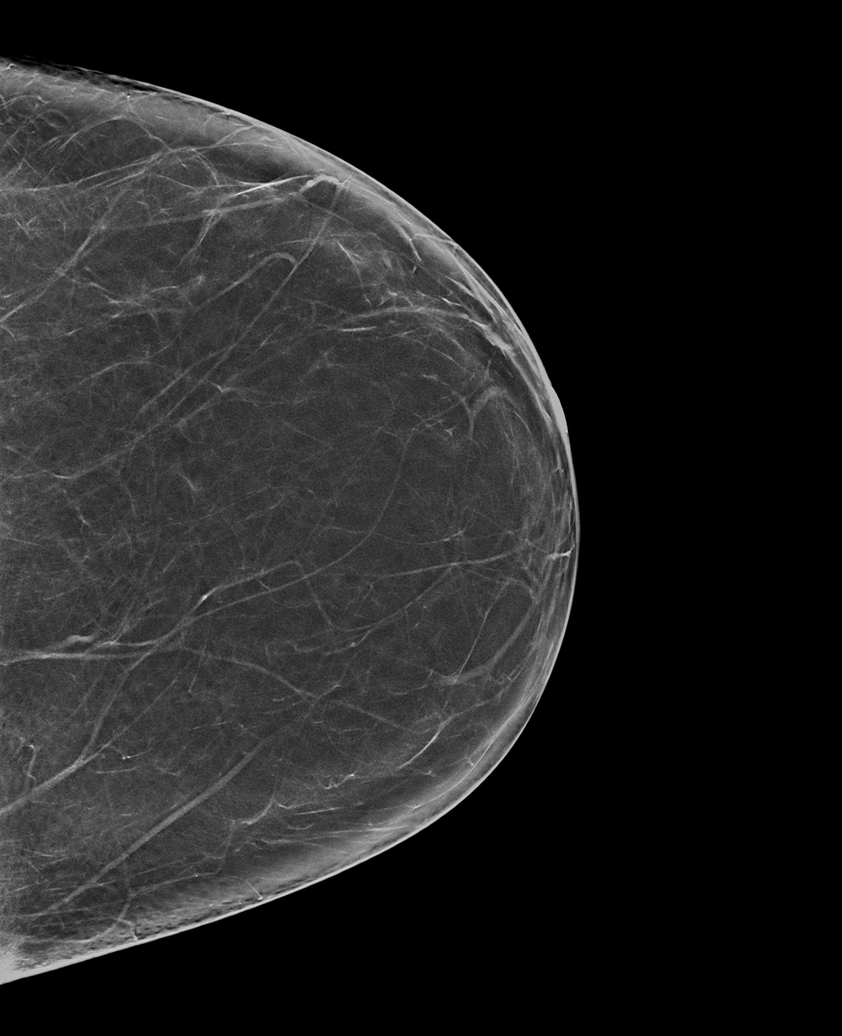

[R CC synth-2D (1 of 2)]
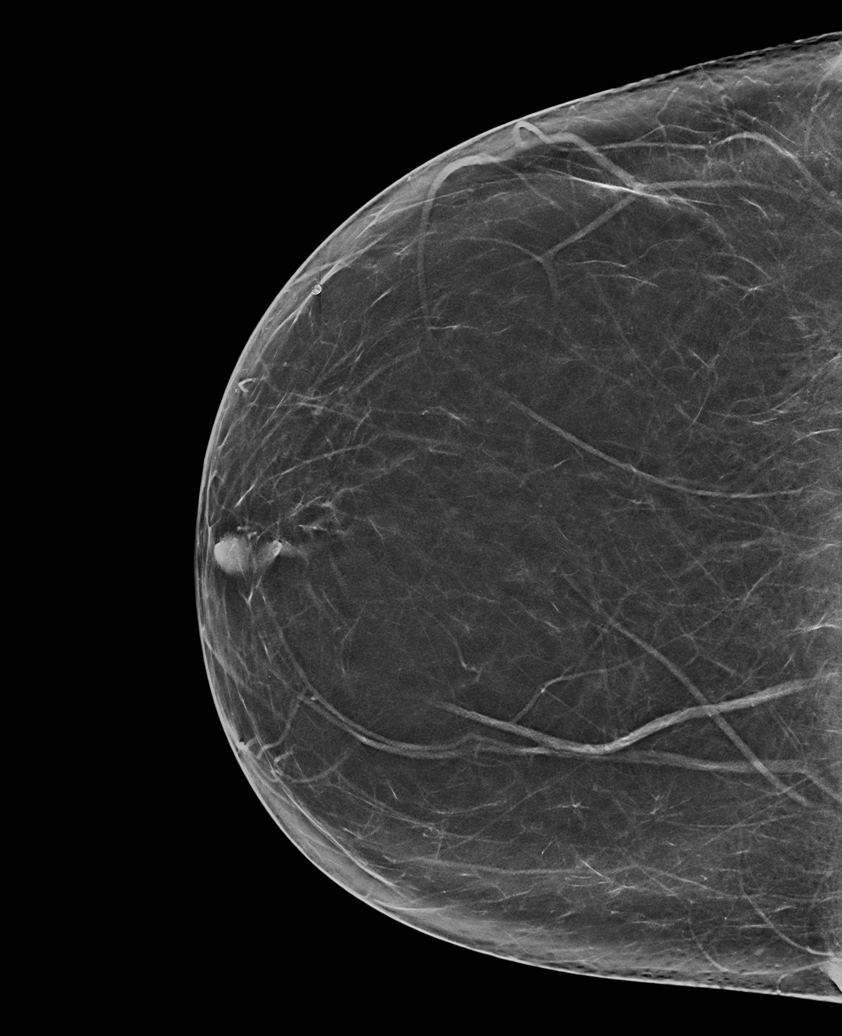

[R CC synth-2D (2 of 2)]
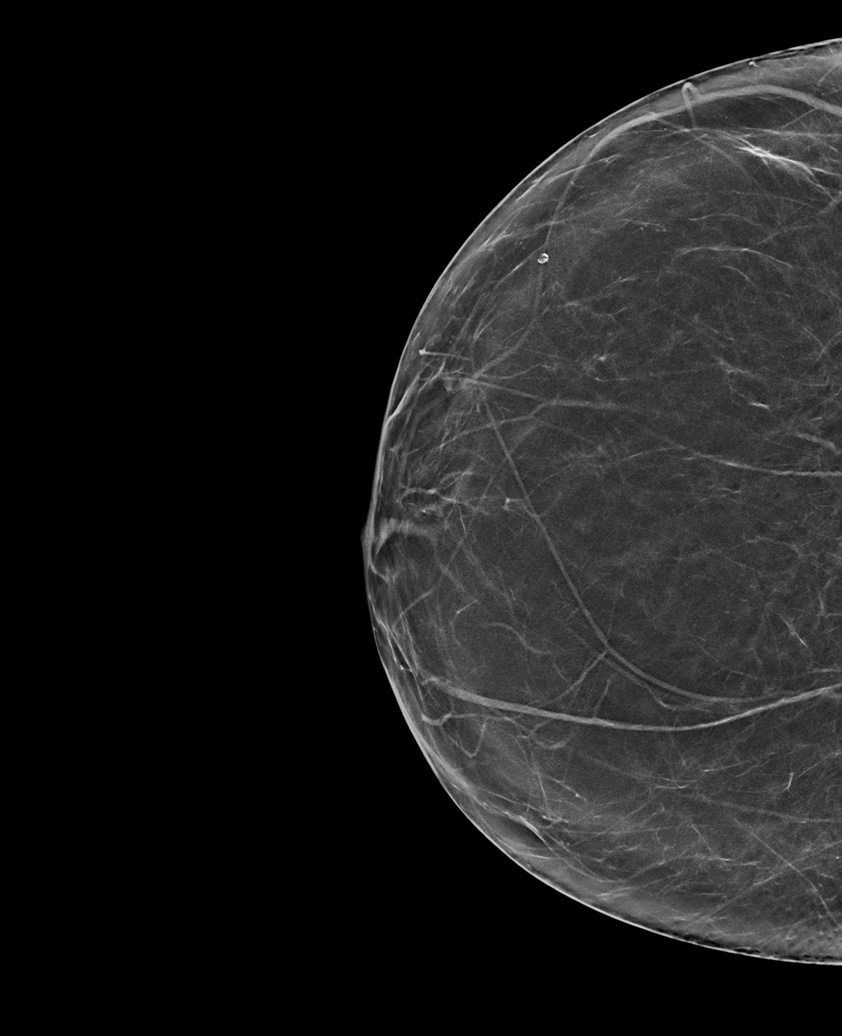

[6 of 36 positions shown; findings below may reference images not displayed]

ACR Breast Density Category b: There are scattered areas of
fibroglandular density.
FINDINGS: There are no findings suspicious for malignancy.
IMPRESSION: No mammographic evidence of malignancy. A result letter of this
screening mammogram will be mailed directly to the patient.

RECOMMENDATION:
Screening mammogram in one year. (Code:51-O-LD2)

BI-RADS CATEGORY  1: Negative.

## 2022-12-26 ENCOUNTER — Other Ambulatory Visit: Payer: Self-pay | Admitting: Advanced Practice Midwife

## 2022-12-26 DIAGNOSIS — Z1231 Encounter for screening mammogram for malignant neoplasm of breast: Secondary | ICD-10-CM

## 2023-02-26 ENCOUNTER — Ambulatory Visit
Admission: RE | Admit: 2023-02-26 | Discharge: 2023-02-26 | Disposition: A | Discharge status: Home / Self Care | Payer: 59 | Source: Ambulatory Visit | Attending: Advanced Practice Midwife | Admitting: Advanced Practice Midwife

## 2023-02-26 DIAGNOSIS — Z1231 Encounter for screening mammogram for malignant neoplasm of breast: Secondary | ICD-10-CM
